# Patient Record
Sex: Female | Born: 1975 | Race: White | Hispanic: No | Smoking: Current every day smoker
Health system: Southern US, Community
[De-identification: ages and names within clinical notes are randomized; demographics above are authoritative.]

## PROBLEM LIST (undated history)

## (undated) DIAGNOSIS — K219 Gastro-esophageal reflux disease without esophagitis: Secondary | ICD-10-CM

## (undated) DIAGNOSIS — T7840XA Allergy, unspecified, initial encounter: Secondary | ICD-10-CM

## (undated) DIAGNOSIS — E079 Disorder of thyroid, unspecified: Secondary | ICD-10-CM

## (undated) DIAGNOSIS — F329 Major depressive disorder, single episode, unspecified: Secondary | ICD-10-CM

## (undated) DIAGNOSIS — F419 Anxiety disorder, unspecified: Secondary | ICD-10-CM

## (undated) DIAGNOSIS — I10 Essential (primary) hypertension: Secondary | ICD-10-CM

## (undated) DIAGNOSIS — F32A Depression, unspecified: Secondary | ICD-10-CM

## (undated) HISTORY — DX: Major depressive disorder, single episode, unspecified: F32.9

## (undated) HISTORY — DX: Depression, unspecified: F32.A

## (undated) HISTORY — DX: Essential (primary) hypertension: I10

## (undated) HISTORY — DX: Allergy, unspecified, initial encounter: T78.40XA

## (undated) HISTORY — DX: Gastro-esophageal reflux disease without esophagitis: K21.9

## (undated) HISTORY — DX: Disorder of thyroid, unspecified: E07.9

## (undated) HISTORY — PX: TOOTH EXTRACTION: SUR596

## (undated) HISTORY — DX: Anxiety disorder, unspecified: F41.9

## (undated) HISTORY — PX: KNEE SURGERY: SHX244

---

## 1999-04-04 ENCOUNTER — Other Ambulatory Visit: Admission: RE | Admit: 1999-04-04 | Discharge: 1999-04-04 | Payer: Self-pay | Admitting: Obstetrics & Gynecology

## 2000-05-15 ENCOUNTER — Other Ambulatory Visit: Admission: RE | Admit: 2000-05-15 | Discharge: 2000-05-15 | Payer: Self-pay | Admitting: Obstetrics & Gynecology

## 2001-05-22 ENCOUNTER — Other Ambulatory Visit: Admission: RE | Admit: 2001-05-22 | Discharge: 2001-05-22 | Payer: Self-pay | Admitting: Obstetrics & Gynecology

## 2002-05-11 ENCOUNTER — Other Ambulatory Visit: Admission: RE | Admit: 2002-05-11 | Discharge: 2002-05-11 | Payer: Self-pay | Admitting: Obstetrics & Gynecology

## 2002-12-18 ENCOUNTER — Inpatient Hospital Stay (HOSPITAL_COMMUNITY): Admission: AD | Admit: 2002-12-18 | Discharge: 2002-12-21 | Payer: Self-pay | Admitting: Obstetrics & Gynecology

## 2002-12-29 ENCOUNTER — Encounter: Admission: RE | Admit: 2002-12-29 | Discharge: 2003-01-28 | Payer: Self-pay | Admitting: Obstetrics & Gynecology

## 2003-01-29 ENCOUNTER — Encounter: Admission: RE | Admit: 2003-01-29 | Discharge: 2003-02-28 | Payer: Self-pay | Admitting: Obstetrics & Gynecology

## 2003-03-31 ENCOUNTER — Encounter: Admission: RE | Admit: 2003-03-31 | Discharge: 2003-04-30 | Payer: Self-pay | Admitting: Obstetrics & Gynecology

## 2003-05-01 ENCOUNTER — Encounter: Admission: RE | Admit: 2003-05-01 | Discharge: 2003-05-31 | Payer: Self-pay | Admitting: Obstetrics & Gynecology

## 2003-06-09 ENCOUNTER — Other Ambulatory Visit: Admission: RE | Admit: 2003-06-09 | Discharge: 2003-06-09 | Payer: Self-pay | Admitting: Obstetrics & Gynecology

## 2004-06-19 ENCOUNTER — Other Ambulatory Visit: Admission: RE | Admit: 2004-06-19 | Discharge: 2004-06-19 | Payer: Self-pay | Admitting: Obstetrics & Gynecology

## 2006-06-03 ENCOUNTER — Encounter (HOSPITAL_COMMUNITY): Admission: RE | Admit: 2006-06-03 | Discharge: 2006-06-04 | Payer: Self-pay | Admitting: Family Medicine

## 2007-11-23 ENCOUNTER — Emergency Department (HOSPITAL_COMMUNITY): Admission: EM | Admit: 2007-11-23 | Discharge: 2007-11-23 | Payer: Self-pay | Admitting: Emergency Medicine

## 2009-11-03 IMAGING — CR DG KNEE COMPLETE 4+V*R*
4 series · 4 of 4 positions shown · non-contrast
Comparison: None

CLINICAL DATA: She has recurrent patellar dislocation

RIGHT KNEE - COMPLETE 4+ VIEW

[t knee ap right]
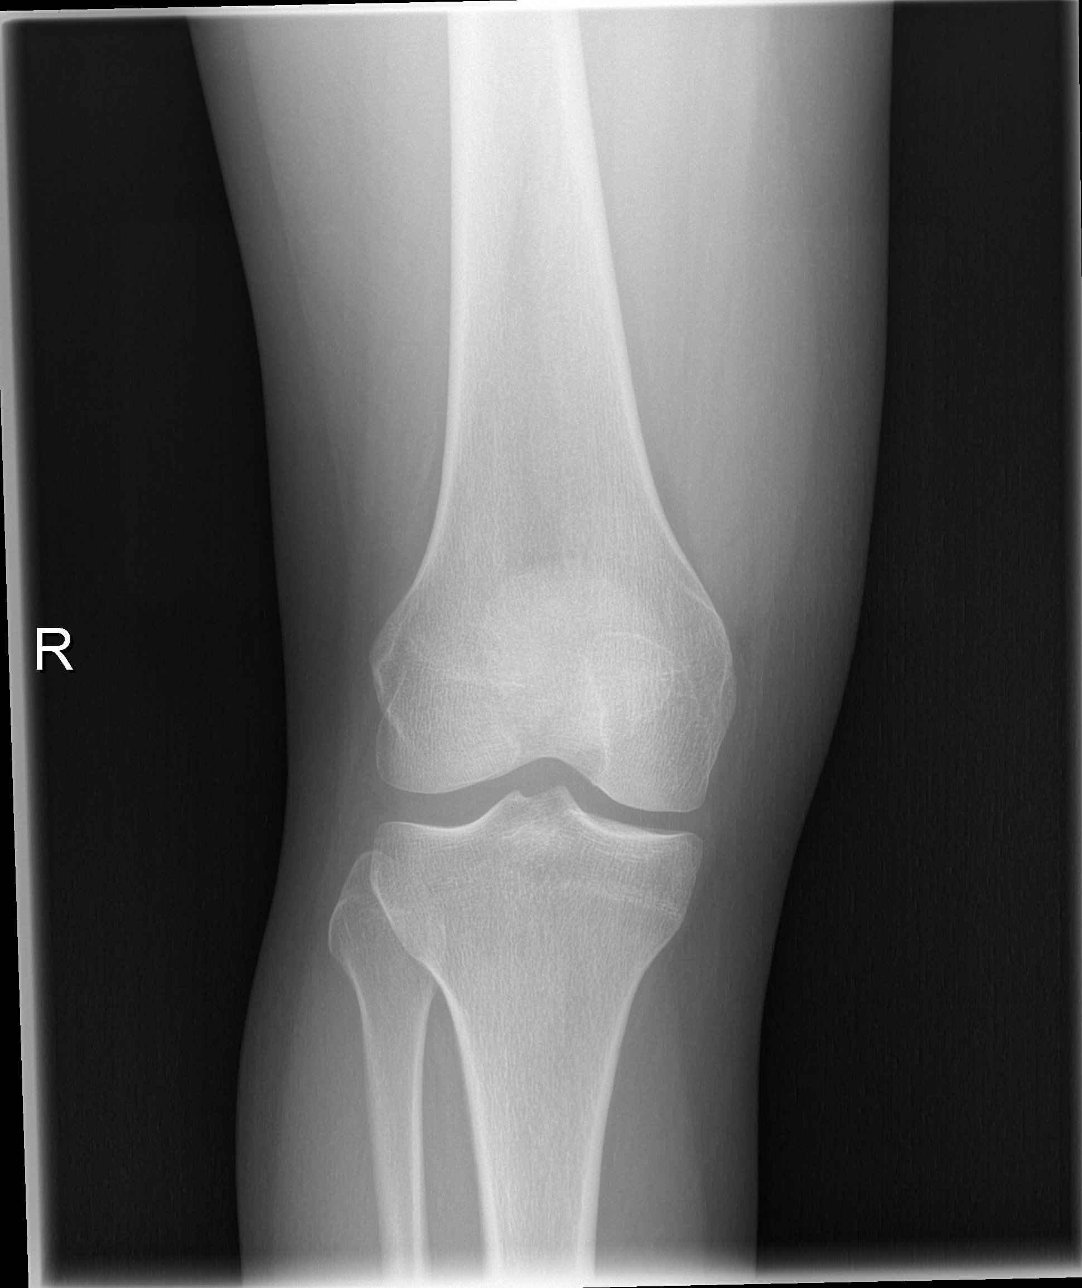

[t knee oblique right (1 of 2)]
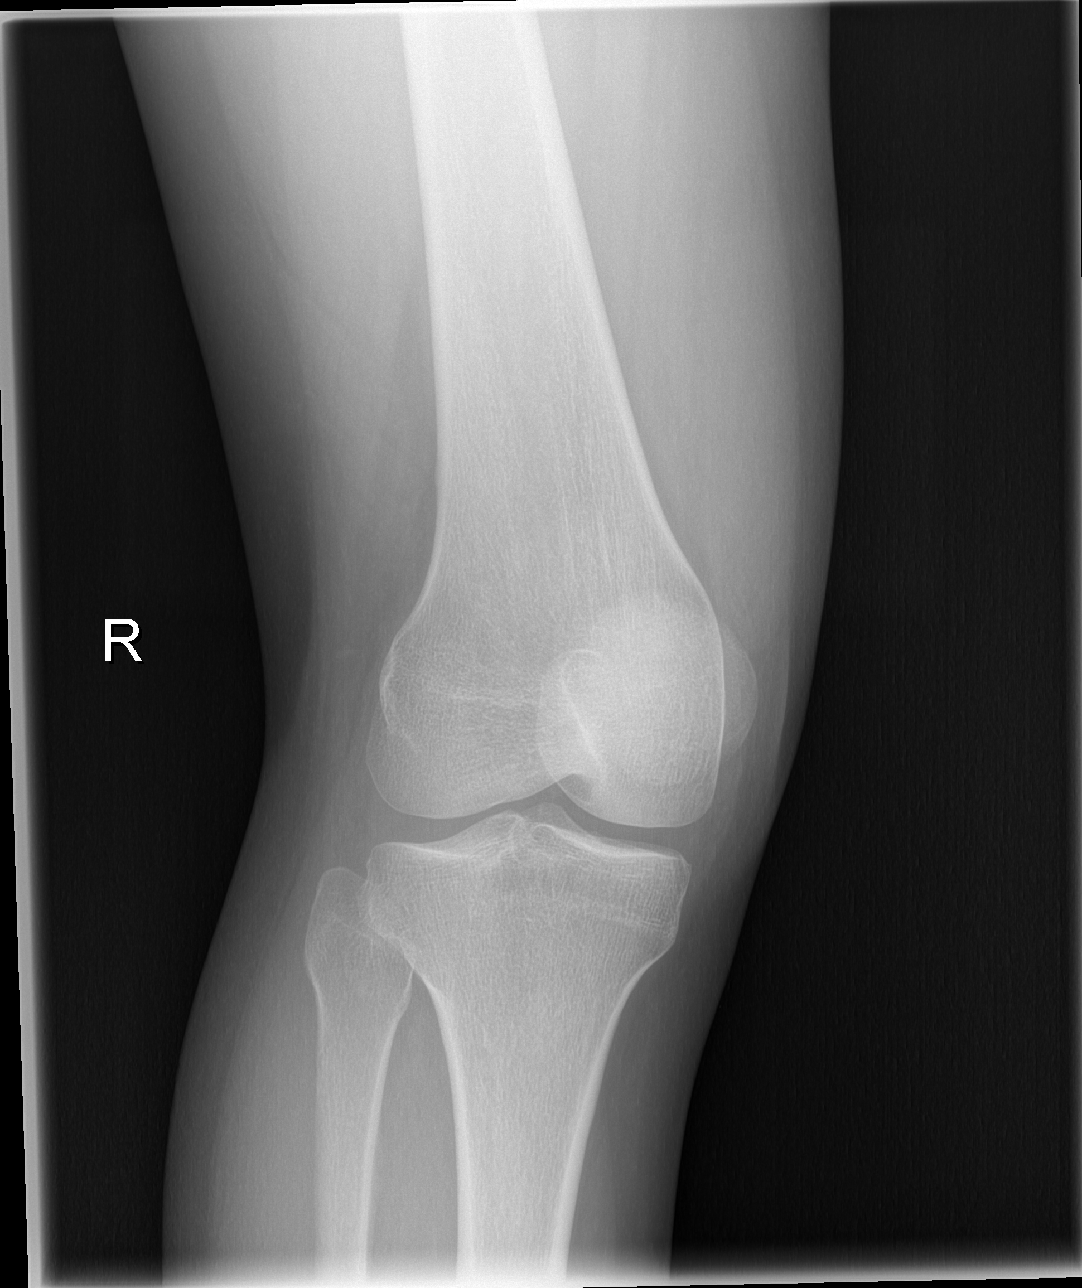

[t knee oblique right (2 of 2)]
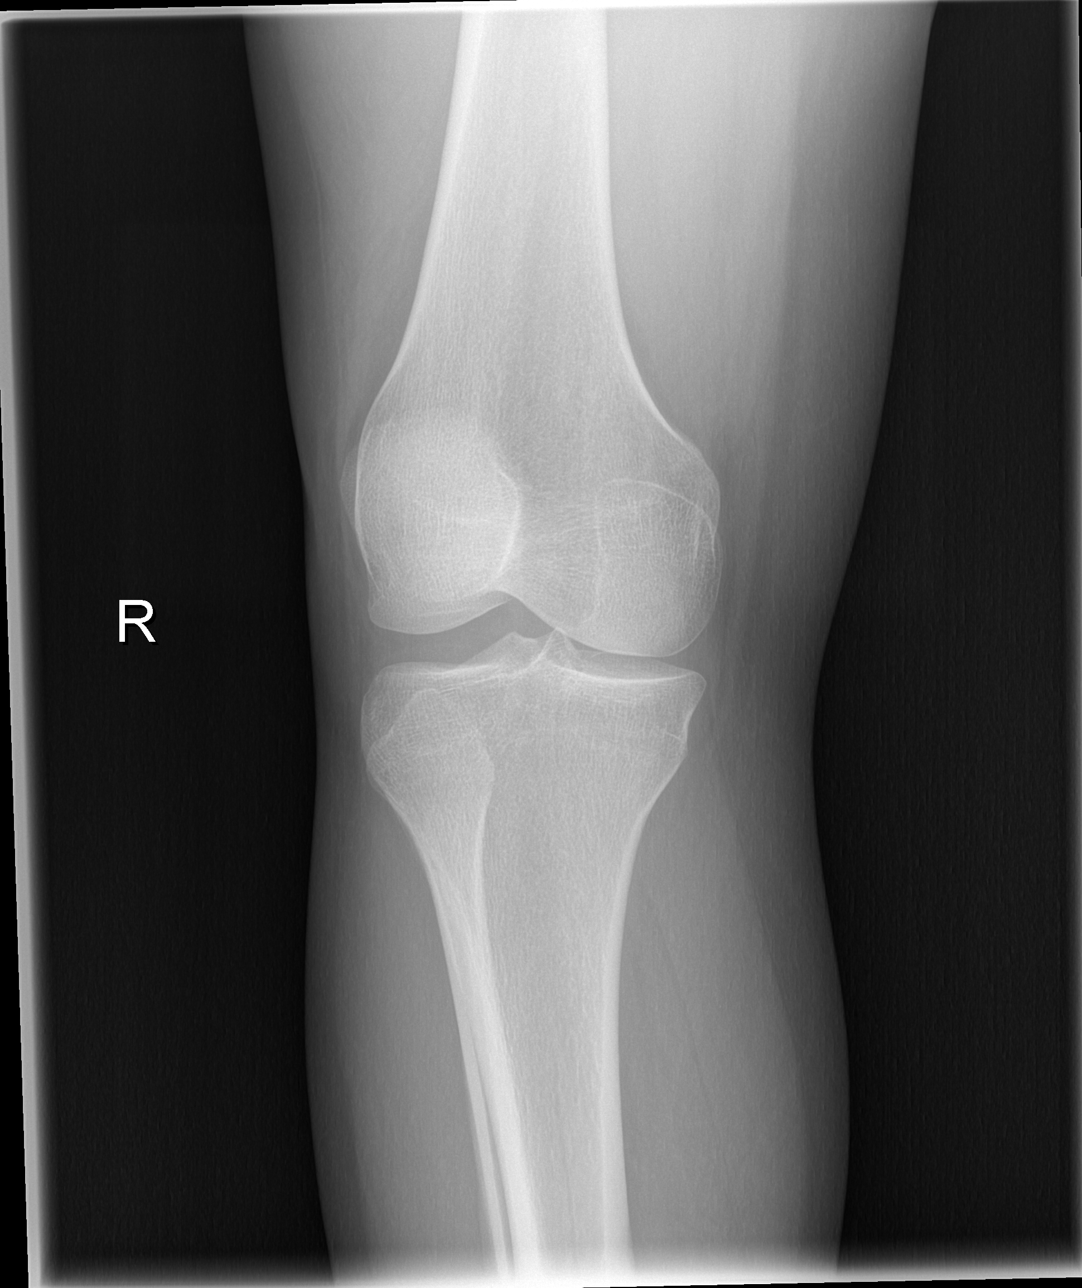

[t knee lat right]
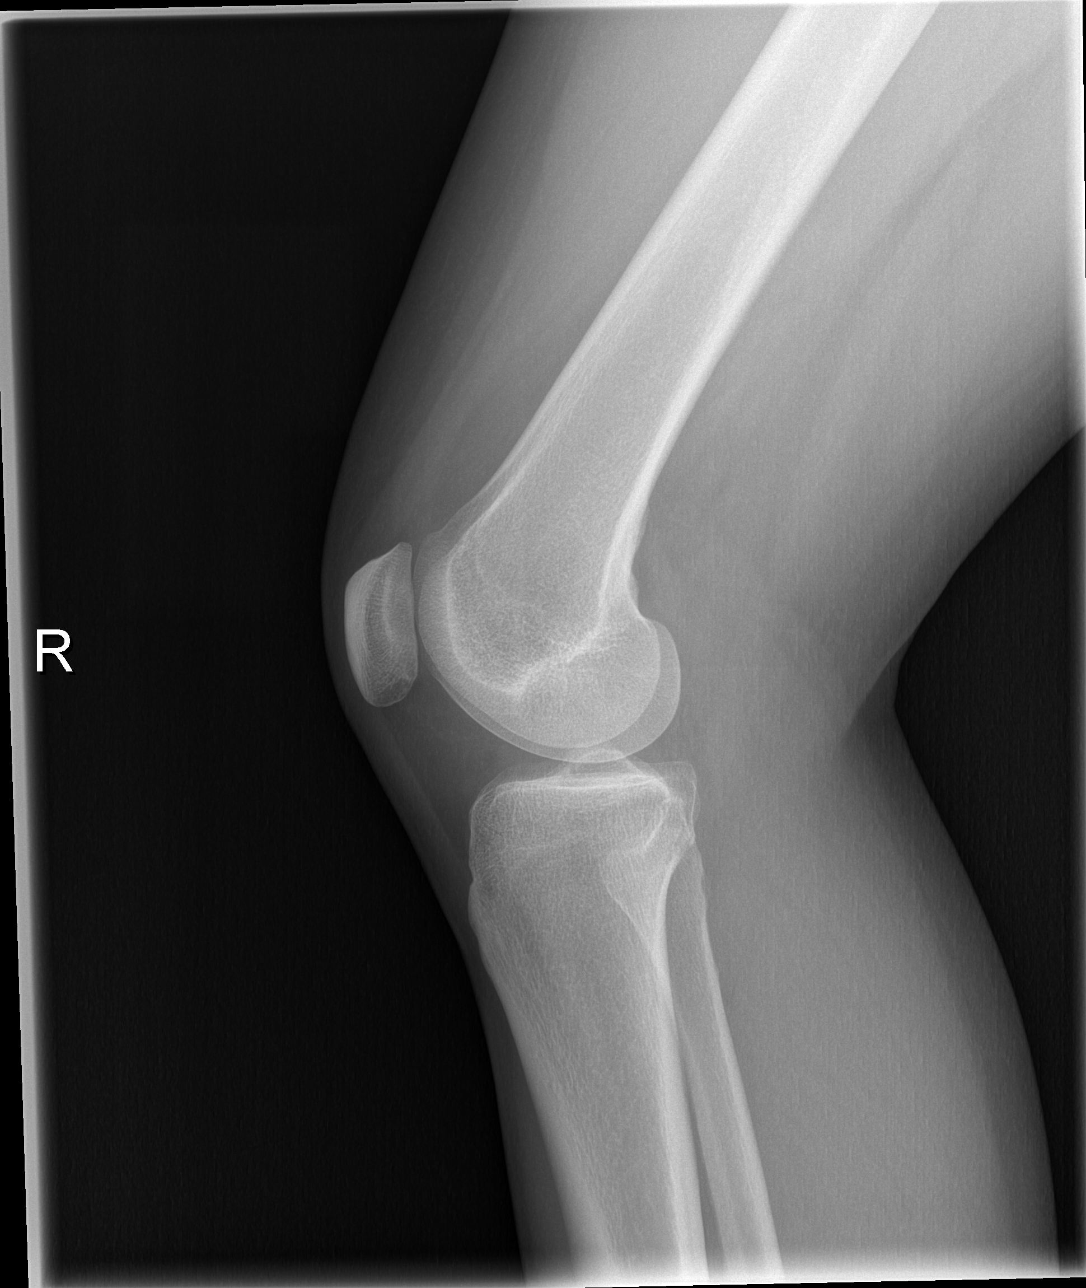

[4 of 4 positions shown; findings below may reference images not displayed]

FINDINGS: No evidence of fracture or dislocation.  No joint
effusion.
IMPRESSION: 1.  No fracture or dislocation.

## 2010-08-11 NOTE — Discharge Summary (Signed)
Donna Chambers, Donna Chambers                      ACCOUNT NO.:  [id number]   MEDICAL RECORD NO.:  [id number]                   PATIENT TYPE:  INP   LOCATION:  9132                                 FACILITY:  [facility]   PHYSICIAN:  Gerrit Friends. Aldona Bar, M.D.                DATE OF BIRTH:  1976/01/21   DATE OF ADMISSION:  12/18/2002  DATE OF DISCHARGE:  12/21/2002                                 DISCHARGE SUMMARY   DISCHARGE DIAGNOSES:  1. Term pregnancy, delivered 7 pound 3 ounce female infant, Apgars 8 and 9.  2. Blood type O+.  3. Mild preeclampsia.   PROCEDURE:  1. Induction of labor.  2. Normal spontaneous delivery.  3. Repair of episiotomy and tears.   SUMMARY:  This 35 year old primigravida was admitted at almost [redacted] weeks  gestation with elevated blood pressure and swelling associated with an 8  pound weight gain over the past two weeks and trace proteinuria.  She was  positive for group B Strep antenatally.  At time of admission she was 3 cm  dilated, 90% effaced with the vertex at -1 station.  Baby was very  reassuring on monitoring.  She did have a reduced potassium.  She was begun  on low dose Pitocin after evaluation and progressed in labor with a  subsequent normal spontaneous delivery of a viable female infant at about noon  on September 25.  Infant weighed 7 pounds 3 ounces and had Apgars of 8 and  9.  There was a large sulcus tear and a midline episiotomy and bilateral  periurethral lacerations which required repair.  She was taken to the  operating room for repair of these lacerations.  Vaginal packing was placed  which was removed on September 26.  On September 26 her potassium was still  slightly low but on the morning of September 27 was 3.6.  Her discharge  hemoglobin on September 27 was 7.6 with a white count of 11,100 and a  platelet count of 182,000.  Her blood pressure was normal.  She was desires  of discharge on the morning of September 27 and accordingly was given all  appropriate instructions and understood all instructions well.   DISCHARGE MEDICATIONS:  1. Vitamins one a day.  2. Ferrous sulfate 300 mg one to two daily.  3. Motrin 600 mg q.6h. as needed for pain.  4. Tylox one to two q.4-6h. as needed for severe pain.   FOLLOWUP:  She will return to the office for follow-up in approximately four  weeks' time and was given all explicit careful instructions at the time of  discharge per discharge brochure and understood all instructions well.   CONDITION ON DISCHARGE:  Improved.  Gerrit Friends. Aldona Bar, M.D.    RMW/MEDQ  D:  12/21/2002  T:  12/21/2002  Job:  [id number]

## 2010-08-11 NOTE — Op Note (Signed)
Donna Chambers, Donna Chambers                      ACCOUNT NO.:  [id number]   MEDICAL RECORD NO.:  [id number]                   PATIENT TYPE:  INP   LOCATION:  9132                                 FACILITY:  [facility]   PHYSICIAN:  Randye Lobo, M.D.                DATE OF BIRTH:  [birth year]   DATE OF PROCEDURE:  12/19/2002  DATE OF DISCHARGE:                                 OPERATIVE REPORT   PREOPERATIVE DIAGNOSES:  1. Status post spontaneous vaginal delivery.  2. Left sulcus tear.  3. Bilateral periurethral lacerations.  4. Midline episiotomy.   POSTOPERATIVE DIAGNOSES:  1. Status post spontaneous vaginal delivery.  2. Left sulcus tear.  3. Bilateral periurethral lacerations.  4. Midline episiotomy.   PROCEDURE:  Repair of left sulcus tear, midline episiotomy, and bilateral  periurethral lacerations.   SURGEON:  Randye Lobo, M.D.   ANESTHESIA:  Epidural.   FLUIDS REPLACED:  800 mL Ringer's lactate with 20 units of Pitocin in 1 L of  solution.   ESTIMATED BLOOD LOSS:  125 mL.   URINE OUTPUT:  200 mL of clear yellow urine.   COMPLICATIONS:  None.   INDICATION FOR PROCEDURE:  The patient is a 35 year old gravida 1, para 0,  female, who was admitted on December 18, 2002, at 41 plus 5 weeks'  gestation with pregnancy-induced hypertension.  The patient underwent  induction of labor with Pitocin for the above and went on to have a  spontaneous vaginal delivery of a viable female delivered over a midline  episiotomy on December 19, 2002.  After spontaneous delivery of the  placenta, the vagina was examined and there was a large left sulcus tear  which was appreciated to extend to the left perirectal space.  There was a  midline episiotomy with no third degree extension and bilateral periurethral  lacerations noted.  Due to the extensive nature of the left sulcus tear, a  decision was made to proceed with a repair in the operating room for patient  comfort and for improved  visualization and assistance.  The patient and  family agreed to proceed.   FINDINGS:  Exam on the operating room table demonstrated a large left sulcus  tear, which extended into the left perirectal space extending toward but not  up to the ischial spine.  There was a midline episiotomy with no third  degree extension.  There were bilateral periurethral lacerations.  The  urethra was intact itself.  The cervix had no evidence of any lacerations.  Rectal examination at the termination of the repair demonstrated the absence  of sutures in the rectum, and there was no hematoma palpable in the left  perirectal space.   SPECIMENS:  None.   DESCRIPTION OF PROCEDURE:  The patient was taken from her labor and delivery  suite directly down to the operating room.  Her epidural, which was already  existing, was dosed for surgical anesthesia.  The patient was placed in the  dorsal lithotomy position, and she was prepped and draped in the usual  sterile fashion.  An exam of the operative field was performed and the  findings are as noted above.  The procedure began with repair of the left  sulcus tear.  Sutures were placed of interrupted 0 Vicryl in the left  perirectal space in order to close the dead space in this region and to  create hemostasis, which was actually good when the procedure began.  This  suturing was performed away from the ischial spine.  This was then closed in  layers.  The closure of the mucosa of the vagina was performed with a  running but not locked suture of 2-0 Vicryl, which was brought down to the  hymen.  The hymen itself also needed to be slightly reconstructed in the  midline, and again figure-of-eight sutures were also placed in this region,  where there was a small extension of the laceration.  Along the perineal  body, interrupted sutures of 0 Vicryl were placed to bring together the  superficial perineal muscles.  A subcuticular closure along the perineal  body  was then performed and the knot was tied up at the level of the hymenal  ring.  The periurethral lacerations were closed last with interrupted  sutures of 3-0 chromic.  The patient was catheterized of urine at the end of  the procedure.  All of the sites were re-examined, found to be hemostatic.  The patient was then expressed of clots, and there were none.  The vagina  was packed with a gauze packing with no medication on it.  A rectal exam was  performed, and there were no foreign bodies or hematomas appreciated.  This  completed the patient's procedure.  There were no complications to the  procedure.  The patient tolerated it well.  All needle, instrument, and  sponge counts were correct.                                               Randye Lobo, M.D.    BES/MEDQ  D:  12/19/2002  T:  12/21/2002  Job:  [id number]

## 2014-11-25 ENCOUNTER — Encounter: Payer: Self-pay | Admitting: *Deleted

## 2014-11-25 ENCOUNTER — Encounter: Payer: Self-pay | Admitting: Family Medicine

## 2014-11-25 ENCOUNTER — Ambulatory Visit (INDEPENDENT_AMBULATORY_CARE_PROVIDER_SITE_OTHER): Payer: BLUE CROSS/BLUE SHIELD | Admitting: Family Medicine

## 2014-11-25 ENCOUNTER — Encounter (INDEPENDENT_AMBULATORY_CARE_PROVIDER_SITE_OTHER): Payer: Self-pay

## 2014-11-25 VITALS — BP 126/64 | HR 77 | Temp 98.0°F | Ht 63.5 in | Wt 180.0 lb

## 2014-11-25 DIAGNOSIS — F418 Other specified anxiety disorders: Secondary | ICD-10-CM | POA: Diagnosis not present

## 2014-11-25 DIAGNOSIS — I10 Essential (primary) hypertension: Secondary | ICD-10-CM | POA: Diagnosis not present

## 2014-11-25 DIAGNOSIS — E0590 Thyrotoxicosis, unspecified without thyrotoxic crisis or storm: Secondary | ICD-10-CM

## 2014-11-25 LAB — CBC WITH DIFFERENTIAL/PLATELET
BASOS ABS: 0 10*3/uL (ref 0.0–0.1)
Basophils Relative: 0.3 % (ref 0.0–3.0)
EOS ABS: 0.4 10*3/uL (ref 0.0–0.7)
Eosinophils Relative: 4.9 % (ref 0.0–5.0)
HCT: 39 % (ref 36.0–46.0)
Hemoglobin: 12.9 g/dL (ref 12.0–15.0)
LYMPHS ABS: 2.4 10*3/uL (ref 0.7–4.0)
Lymphocytes Relative: 28.5 % (ref 12.0–46.0)
MCHC: 33.1 g/dL (ref 30.0–36.0)
MCV: 93.2 fl (ref 78.0–100.0)
MONO ABS: 0.7 10*3/uL (ref 0.1–1.0)
MONOS PCT: 8.6 % (ref 3.0–12.0)
NEUTROS PCT: 57.7 % (ref 43.0–77.0)
Neutro Abs: 5 10*3/uL (ref 1.4–7.7)
Platelets: 335 10*3/uL (ref 150.0–400.0)
RBC: 4.19 Mil/uL (ref 3.87–5.11)
RDW: 15.3 % (ref 11.5–15.5)
WBC: 8.6 10*3/uL (ref 4.0–10.5)

## 2014-11-25 LAB — COMPREHENSIVE METABOLIC PANEL
ALK PHOS: 85 U/L (ref 39–117)
ALT: 14 U/L (ref 0–35)
AST: 14 U/L (ref 0–37)
Albumin: 4.2 g/dL (ref 3.5–5.2)
BILIRUBIN TOTAL: 0.3 mg/dL (ref 0.2–1.2)
BUN: 8 mg/dL (ref 6–23)
CO2: 32 meq/L (ref 19–32)
CREATININE: 0.78 mg/dL (ref 0.40–1.20)
Calcium: 9.4 mg/dL (ref 8.4–10.5)
Chloride: 103 mEq/L (ref 96–112)
GFR: 87.03 mL/min (ref >60.00)
GLUCOSE: 88 mg/dL (ref 70–99)
Potassium: 4 mEq/L (ref 3.5–5.1)
SODIUM: 138 meq/L (ref 135–145)
TOTAL PROTEIN: 7.2 g/dL (ref 6.0–8.3)

## 2014-11-25 LAB — LIPID PANEL
CHOL/HDL RATIO: 4
Cholesterol: 173 mg/dL (ref 0–200)
HDL: 38.4 mg/dL — ABNORMAL LOW (ref >39.00)
LDL Cholesterol: 101 mg/dL — ABNORMAL HIGH (ref 0–99)
NONHDL: 134.29
Triglycerides: 167 mg/dL — ABNORMAL HIGH (ref 0.0–149.0)
VLDL: 33.4 mg/dL (ref 0.0–40.0)

## 2014-11-25 LAB — T4, FREE: Free T4: 0.88 ng/dL (ref 0.60–1.60)

## 2014-11-25 LAB — TSH: TSH: 2.7 u[IU]/mL (ref 0.35–4.50)

## 2014-11-25 NOTE — Patient Instructions (Signed)
Great to see you. We will call you with your lab results and you can view them online.  

## 2014-11-25 NOTE — Assessment & Plan Note (Signed)
Followed by endo. She does want labs today. Awaiting records. Orders Placed This Encounter  Procedures  . CBC with Differential/Platelet  . Comprehensive metabolic panel  . Lipid panel  . TSH  . T4, Free

## 2014-11-25 NOTE — Progress Notes (Signed)
Subjective:   Patient ID: Donna Chambers, female    DOB: Apr 22, 1975, 39 y.o.   MRN: [id number]  Donna Chambers is a pleasant 39 y.o. year old female who presents to clinic today with [place]  on 11/25/2014  HPI:  Depression and anxiety- followed by Letta Moynahan. Symptoms well controlled on current rxs- Wellbutrin 300 mg XL daily, Effexor 300 mg daily, gabapentin and as needed xanax and lunesta.  Hyperthyroidism- followed by endocrinology, Dr Wilson Singer.  Takes beta blocker daily but has not needed any other rx per pt- awaiting records.  Has no complaints today.  No current outpatient prescriptions on file prior to visit.   No current facility-administered medications on file prior to visit.    Allergies  Allergen Reactions  . Penicillins Hives  . Vicodin [Hydrocodone-Acetaminophen] Nausea Only    Past Medical History  Diagnosis Date  . Depression   . GERD (gastroesophageal reflux disease)   . Allergy   . Hypertension   . Thyroid disease     Past Surgical History  Procedure Laterality Date  . Knee surgery Right   . Tooth extraction      Family History  Problem Relation Age of Onset  . Cancer Mother   . Alcohol abuse Father   . Hyperlipidemia Father   . Heart disease Father   . Hypertension Father   . Migraines Sister   . Cancer Paternal Grandmother   . Hyperlipidemia Paternal Grandmother   . Hypertension Paternal Grandmother   . Heart disease Paternal Grandmother     Social History   Social History  . Marital Status: Married    Spouse Name: N/A  . Number of Children: N/A  . Years of Education: N/A   Occupational History  . Not on file.   Social History Main Topics  . Smoking status: Current Every Day Smoker    Types: E-cigarettes  . Smokeless tobacco: Never Used  . Alcohol Use: Yes  . Drug Use: No  . Sexual Activity: Yes    Birth Control/ Protection: IUD   Other Topics Concern  . 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   Review of Systems  Constitutional: Negative.   HENT: Negative.   Respiratory: Negative.   Cardiovascular: Negative.   Gastrointestinal: Negative.   Genitourinary: Negative.   Musculoskeletal: Negative.   Skin: Negative.   Allergic/Immunologic: Negative.   Neurological: Negative.   Hematological: Negative.   Psychiatric/Behavioral: Negative.   All other systems reviewed and are negative.      Objective:    BP 126/64 mmHg  Pulse 77  Temp(Src) 98 F (36.7 C) (Oral)  Ht 5' 3.5" (1.613 m)  Wt 180 lb (81.647 kg)  BMI 31.38 kg/m2  SpO2 96%   Physical Exam  Constitutional: She is oriented to person, place, and time. She appears well-developed and well-nourished. No distress.  HENT:  Head: Normocephalic and atraumatic.  Eyes: Conjunctivae are normal.  Neck: Normal range of motion.  Cardiovascular: Normal rate and regular rhythm.   Pulmonary/Chest: Effort normal and breath sounds normal. No respiratory distress. She has no wheezes. She has no rales.  Abdominal: Soft.  Musculoskeletal: Normal range of motion. She exhibits no edema.  Neurological: She is alert and oriented to person, place, and time. No cranial nerve deficit.  Skin: Skin is warm and dry.  Psychiatric: She has a normal mood and affect. Her behavior is normal. Judgment and thought content normal.  Nursing note and vitals reviewed.         Assessment & Plan:   Depression with anxiety  Essential hypertension  Hyperthyroidism No Follow-up on file.

## 2014-11-25 NOTE — Progress Notes (Signed)
Pre visit review using our clinic review tool, if applicable. No additional management support is needed unless otherwise documented below in the visit note. 

## 2014-11-25 NOTE — Assessment & Plan Note (Signed)
Well controlled.  No changes made. 

## 2014-11-25 NOTE — Assessment & Plan Note (Signed)
Symptoms well controlled and followed by psych. No changes made.

## 2015-03-01 ENCOUNTER — Other Ambulatory Visit: Payer: Self-pay

## 2015-03-01 MED ORDER — METOPROLOL SUCCINATE ER 50 MG PO TB24: 50.0000 mg | ORAL_TABLET | Freq: Every day | ORAL | Status: DC

## 2015-03-01 NOTE — Telephone Encounter (Signed)
Pt request 90 day refill metoprolol to [facility]; pt seen 11/25/14 and refilled per protocol; pt advised refill done.

## 2015-11-30 ENCOUNTER — Other Ambulatory Visit: Payer: Self-pay | Admitting: *Deleted

## 2015-11-30 MED ORDER — METOPROLOL SUCCINATE ER 50 MG PO TB24
50.0000 mg | ORAL_TABLET | Freq: Every day | ORAL | 0 refills | Status: DC
Start: 2015-11-30 — End: 2015-12-26

## 2015-12-26 ENCOUNTER — Other Ambulatory Visit: Payer: Self-pay | Admitting: Family Medicine

## 2016-01-03 ENCOUNTER — Other Ambulatory Visit: Payer: Self-pay

## 2016-01-03 NOTE — Telephone Encounter (Signed)
Ok to refill one time only.  Needs OV for further refills. 

## 2016-01-03 NOTE — Telephone Encounter (Signed)
Pt left v/m; Dr Deborra Medina has not filled effexor or gabapentin before; pt wants to know if Dr Deborra Medina will fill these 2 meds or should pt ck with doctor that has previously prescribed. Pt last seen 11/25/14 and no future appt scheduled.[facility]

## 2016-01-05 MED ORDER — VENLAFAXINE HCL ER 150 MG PO CP24: 150.0000 mg | ORAL_CAPSULE | Freq: Two times a day (BID) | ORAL | 0 refills | Status: DC

## 2016-01-05 MED ORDER — GABAPENTIN 600 MG PO TABS: 600.0000 mg | ORAL_TABLET | Freq: Three times a day (TID) | ORAL | 0 refills | Status: DC

## 2016-01-27 ENCOUNTER — Other Ambulatory Visit: Payer: Self-pay | Admitting: Family Medicine

## 2016-02-05 ENCOUNTER — Other Ambulatory Visit: Payer: Self-pay | Admitting: Family Medicine

## 2016-02-05 DIAGNOSIS — Z01419 Encounter for gynecological examination (general) (routine) without abnormal findings: Secondary | ICD-10-CM

## 2016-02-08 ENCOUNTER — Other Ambulatory Visit: Payer: Self-pay | Admitting: Family Medicine

## 2016-02-08 ENCOUNTER — Other Ambulatory Visit: Payer: BLUE CROSS/BLUE SHIELD

## 2016-02-14 ENCOUNTER — Encounter: Payer: BLUE CROSS/BLUE SHIELD | Admitting: Family Medicine

## 2016-02-15 ENCOUNTER — Other Ambulatory Visit (INDEPENDENT_AMBULATORY_CARE_PROVIDER_SITE_OTHER): Payer: BLUE CROSS/BLUE SHIELD

## 2016-02-15 DIAGNOSIS — Z01419 Encounter for gynecological examination (general) (routine) without abnormal findings: Secondary | ICD-10-CM | POA: Diagnosis not present

## 2016-02-15 LAB — CBC WITH DIFFERENTIAL/PLATELET
BASOS PCT: 0.3 % (ref 0.0–3.0)
Basophils Absolute: 0 10*3/uL (ref 0.0–0.1)
EOS PCT: 4.3 % (ref 0.0–5.0)
Eosinophils Absolute: 0.3 10*3/uL (ref 0.0–0.7)
HEMATOCRIT: 39 % (ref 36.0–46.0)
HEMOGLOBIN: 13.1 g/dL (ref 12.0–15.0)
LYMPHS PCT: 26 % (ref 12.0–46.0)
Lymphs Abs: 1.9 10*3/uL (ref 0.7–4.0)
MCHC: 33.6 g/dL (ref 30.0–36.0)
MCV: 91.2 fl (ref 78.0–100.0)
MONO ABS: 0.6 10*3/uL (ref 0.1–1.0)
MONOS PCT: 7.8 % (ref 3.0–12.0)
Neutro Abs: 4.6 10*3/uL (ref 1.4–7.7)
Neutrophils Relative %: 61.6 % (ref 43.0–77.0)
Platelets: 335 10*3/uL (ref 150.0–400.0)
RBC: 4.27 Mil/uL (ref 3.87–5.11)
RDW: 15.2 % (ref 11.5–15.5)
WBC: 7.4 10*3/uL (ref 4.0–10.5)

## 2016-02-15 LAB — COMPREHENSIVE METABOLIC PANEL
ALBUMIN: 4.2 g/dL (ref 3.5–5.2)
ALK PHOS: 83 U/L (ref 39–117)
ALT: 13 U/L (ref 0–35)
AST: 13 U/L (ref 0–37)
BUN: 8 mg/dL (ref 6–23)
CALCIUM: 9.1 mg/dL (ref 8.4–10.5)
CHLORIDE: 101 meq/L (ref 96–112)
CO2: 30 mEq/L (ref 19–32)
Creatinine, Ser: 0.78 mg/dL (ref 0.40–1.20)
GFR: 86.5 mL/min (ref >60.00)
Glucose, Bld: 90 mg/dL (ref 70–99)
POTASSIUM: 3.8 meq/L (ref 3.5–5.1)
Sodium: 137 mEq/L (ref 135–145)
TOTAL PROTEIN: 7 g/dL (ref 6.0–8.3)
Total Bilirubin: 0.3 mg/dL (ref 0.2–1.2)

## 2016-02-15 LAB — TSH: TSH: 1.89 u[IU]/mL (ref 0.35–4.50)

## 2016-02-15 LAB — LIPID PANEL
CHOLESTEROL: 161 mg/dL (ref 0–200)
HDL: 41.4 mg/dL (ref >39.00)
LDL CALC: 92 mg/dL (ref 0–99)
NonHDL: 120.05
TRIGLYCERIDES: 140 mg/dL (ref 0.0–149.0)
Total CHOL/HDL Ratio: 4
VLDL: 28 mg/dL (ref 0.0–40.0)

## 2016-02-21 ENCOUNTER — Other Ambulatory Visit: Payer: Self-pay | Admitting: Family Medicine

## 2016-02-21 ENCOUNTER — Encounter: Payer: Self-pay | Admitting: Family Medicine

## 2016-02-21 ENCOUNTER — Ambulatory Visit (INDEPENDENT_AMBULATORY_CARE_PROVIDER_SITE_OTHER): Payer: BLUE CROSS/BLUE SHIELD | Admitting: Family Medicine

## 2016-02-21 VITALS — BP 114/62 | Temp 98.3°F | Ht 63.5 in | Wt 178.0 lb

## 2016-02-21 DIAGNOSIS — F418 Other specified anxiety disorders: Secondary | ICD-10-CM

## 2016-02-21 DIAGNOSIS — K219 Gastro-esophageal reflux disease without esophagitis: Secondary | ICD-10-CM | POA: Insufficient documentation

## 2016-02-21 DIAGNOSIS — I10 Essential (primary) hypertension: Secondary | ICD-10-CM

## 2016-02-21 DIAGNOSIS — Z23 Encounter for immunization: Secondary | ICD-10-CM

## 2016-02-21 DIAGNOSIS — Z01419 Encounter for gynecological examination (general) (routine) without abnormal findings: Secondary | ICD-10-CM | POA: Diagnosis not present

## 2016-02-21 DIAGNOSIS — E0590 Thyrotoxicosis, unspecified without thyrotoxic crisis or storm: Secondary | ICD-10-CM | POA: Diagnosis not present

## 2016-02-21 NOTE — Progress Notes (Signed)
Subjective:   Patient ID: Donna Chambers, female    DOB: [birth year], 40 y.o.   MRN: [id number]  Donna Chambers is a pleasant 40 y.o. year old female who presents to clinic today with Annual Exam  on 02/21/2016  HPI:  Has IUD. Has GYN, Dr. Stann Mainland- had pap smear this month.  Depression and anxiety- followed by Letta Moynahan. Symptoms well controlled on current rxs- Wellbutrin 300 mg XL daily, Effexor 300 mg daily, gabapentin and as needed xanax and lunesta.  Hyperthyroidism- followed by endocrinology, Dr Wilson Singer.  Takes beta blocker daily. Lab Results  Component Value Date   TSH 1.89 02/15/2016    Lab Results  Component Value Date   CHOL 161 02/15/2016   HDL 41.40 02/15/2016   LDLCALC 92 02/15/2016   TRIG 140.0 02/15/2016   CHOLHDL 4 02/15/2016   Lab Results  Component Value Date   CREATININE 0.78 02/15/2016   Lab Results  Component Value Date   WBC 7.4 02/15/2016   HGB 13.1 02/15/2016   HCT 39.0 02/15/2016   MCV 91.2 02/15/2016   PLT 335.0 02/15/2016   Lab Results  Component Value Date   ALT 13 02/15/2016   AST 13 02/15/2016   ALKPHOS 83 02/15/2016   BILITOT 0.3 02/15/2016     Current Outpatient Prescriptions on File Prior to Visit  Medication Sig Dispense Refill  . ALPRAZolam (XANAX) 0.5 MG tablet 0.5 mg 3 (three) times daily.   0  . buPROPion (WELLBUTRIN XL) 300 MG 24 hr tablet Take 300 mg by mouth every morning.  3  . Eszopiclone 3 MG TABS Take 3 mg by mouth at bedtime.  2  . Fish Oil-Cholecalciferol (FISH OIL + D3 PO) Take by mouth.    . gabapentin (NEURONTIN) 600 MG tablet TAKE ONE TABLET BY MOUTH 3 TIMES DAILY 30 tablet 0  . metoprolol succinate (TOPROL-XL) 50 MG 24 hr tablet TAKE 1 TABLET BY MOUTH DAILY. MUST HAVE OFFICE VISIT FOR MORE REFILLS 30 tablet 0  . Misc Natural Products (TART CHERRY ADVANCED PO) Take by mouth.    . Multiple Vitamin (MULTIVITAMIN) capsule Take 4 capsules by mouth daily.    Marland Kitchen omeprazole (PRILOSEC) 40 MG capsule Take  40 mg by mouth daily.  3  . venlafaxine XR (EFFEXOR-XR) 150 MG 24 hr capsule TAKE 1 CAPSULE BY MOUTH TWICE DAILY 60 capsule 0   No current facility-administered medications on file prior to visit.     Allergies  Allergen Reactions  . Penicillins Hives  . Vicodin [Hydrocodone-Acetaminophen] Nausea Only    Past Medical History:  Diagnosis Date  . Allergy   . Depression   . GERD (gastroesophageal reflux disease)   . Hypertension   . Thyroid disease     Past Surgical History:  Procedure Laterality Date  . KNEE SURGERY Right   . TOOTH EXTRACTION      Family History  Problem Relation Age of Onset  . Cancer Mother   . Alcohol abuse Father   . Hyperlipidemia Father   . Heart disease Father   . Hypertension Father   . Migraines Sister   . Cancer Paternal Grandmother   . Hyperlipidemia Paternal Grandmother   . Hypertension Paternal Grandmother   . Heart disease Paternal Grandmother     Social History   Social History  . Marital status: Married    Spouse name: N/A  . Number of children: N/A  . Years of education: N/A   Occupational History  . Not on file.  Social History Main Topics  . Smoking status: Current Every Day Smoker    Types: E-cigarettes  . Smokeless tobacco: Never Used  . Alcohol use Yes  . Drug use: No  . Sexual activity: Yes    Birth control/ protection: IUD   Other Topics Concern  . 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   Review of Systems  Constitutional: Negative.   HENT: Negative.   Respiratory: Negative.   Cardiovascular: Negative.   Gastrointestinal: Negative.   Genitourinary: Negative.   Musculoskeletal: Negative.   Skin: Negative.   Allergic/Immunologic: Negative.   Neurological: Negative.   Hematological: Negative.   Psychiatric/Behavioral:  Negative.   All other systems reviewed and are negative.      Objective:    BP 114/62   Temp 98.3 F (36.8 C) (Oral)   Ht 5' 3.5" (1.613 m)   Wt 178 lb (80.7 kg)   SpO2 97%   BMI 31.04 kg/m    Physical Exam  Constitutional: She is oriented to person, place, and time. She appears well-developed and well-nourished. No distress.  HENT:  Head: Normocephalic and atraumatic.  Eyes: Conjunctivae are normal.  Neck: Normal range of motion.  Cardiovascular: Normal rate and regular rhythm.   Pulmonary/Chest: Effort normal and breath sounds normal. No respiratory distress. She has no wheezes. She has no rales.  Abdominal: Soft.  Musculoskeletal: Normal range of motion. She exhibits no edema.  Neurological: She is alert and oriented to person, place, and time. No cranial nerve deficit.  Skin: Skin is warm and dry.  Psychiatric: She has a normal mood and affect. Her behavior is normal. Judgment and thought content normal.  Nursing note and vitals reviewed.         Assessment & Plan:   Hyperthyroidism  Well woman exam  Depression with anxiety  Essential hypertension  Gastroesophageal reflux disease, esophagitis presence not specified  Need for influenza vaccination - Plan: Flu Vaccine QUAD 36+ mos IM (Fluarix & Fluzone Quad PF No Follow-up on file.

## 2016-02-21 NOTE — Assessment & Plan Note (Signed)
Well controlled.  No changes made. 

## 2016-02-21 NOTE — Assessment & Plan Note (Signed)
Reviewed preventive care protocols, scheduled due services, and updated immunizations Discussed nutrition, exercise, diet, and healthy lifestyle.  

## 2016-02-21 NOTE — Progress Notes (Signed)
Pre visit review using our clinic review tool, if applicable. No additional management support is needed unless otherwise documented below in the visit note. 

## 2016-02-21 NOTE — Patient Instructions (Signed)
Great to see you. Happy Holidays.   

## 2016-02-28 ENCOUNTER — Ambulatory Visit (INDEPENDENT_AMBULATORY_CARE_PROVIDER_SITE_OTHER): Payer: BLUE CROSS/BLUE SHIELD | Admitting: Family

## 2016-02-28 ENCOUNTER — Encounter: Payer: Self-pay | Admitting: Family

## 2016-02-28 DIAGNOSIS — J309 Allergic rhinitis, unspecified: Secondary | ICD-10-CM | POA: Diagnosis not present

## 2016-02-28 NOTE — Progress Notes (Signed)
Pre visit review using our clinic review tool, if applicable. No additional management support is needed unless otherwise documented below in the visit note. 

## 2016-02-28 NOTE — Patient Instructions (Signed)
Pleasure meeting you.  Suspect allergies.  Conservative treatment  Trial OTC hydrocortisone for ear itching.

## 2016-02-28 NOTE — Progress Notes (Signed)
Subjective:    Patient ID: Donna Chambers, female    DOB: [birth year], 40 y.o.   MRN: [id number]  CC: Donna Chambers is a 40 y.o. female who presents today for an acute visit.    HPI: CC: clear runny sinus congestion and sinus and eye pressure, and left ear itching for past 6 days,unchanged . Endorses HA ( 'not worst HA of life')  tactile warmth.   Tried doxycycline and ibuprofen with little relief . No fever, chills, cough.   Current smoker  H/o seasonal allergies. On allergy shots, claritin, and flonase.     HISTORY:  Past Medical History:  Diagnosis Date  . Allergy   . Depression   . GERD (gastroesophageal reflux disease)   . Hypertension   . Thyroid disease    Past Surgical History:  Procedure Laterality Date  . KNEE SURGERY Right   . TOOTH EXTRACTION     Family History  Problem Relation Age of Onset  . Cancer Mother   . Alcohol abuse Father   . Hyperlipidemia Father   . Heart disease Father   . Hypertension Father   . Migraines Sister   . Cancer Paternal Grandmother   . Hyperlipidemia Paternal Grandmother   . Hypertension Paternal Grandmother   . Heart disease Paternal Grandmother     Allergies: Penicillins and Vicodin [hydrocodone-acetaminophen] Current Outpatient Prescriptions on File Prior to Visit  Medication Sig Dispense Refill  . ALPRAZolam (XANAX) 0.5 MG tablet 0.5 mg 3 (three) times daily.   0  . buPROPion (WELLBUTRIN XL) 300 MG 24 hr tablet Take 300 mg by mouth every morning.  3  . Eszopiclone 3 MG TABS Take 3 mg by mouth at bedtime.  2  . Fish Oil-Cholecalciferol (FISH OIL + D3 PO) Take by mouth.    . gabapentin (NEURONTIN) 600 MG tablet TAKE ONE TABLET BY MOUTH 3 TIMES DAILY 30 tablet 0  . metoprolol succinate (TOPROL-XL) 50 MG 24 hr tablet TAKE ONE TABLET EVERY DAY MUST HAVE OFFICE VISIT FOR MORE REFILLS 90 tablet 2  . Misc Natural Products (TART CHERRY ADVANCED PO) Take by mouth.    . Multiple Vitamin (MULTIVITAMIN) capsule Take 4  capsules by mouth daily.    Marland Kitchen omeprazole (PRILOSEC) 40 MG capsule Take 40 mg by mouth daily.  3  . venlafaxine XR (EFFEXOR-XR) 150 MG 24 hr capsule TAKE 1 CAPSULE BY MOUTH TWICE DAILY 60 capsule 0   No current facility-administered medications on file prior to visit.     Social History  Substance Use Topics  . Smoking status: Current Every Day Smoker    Types: E-cigarettes  . Smokeless tobacco: Never Used  . Alcohol use Yes    Review of Systems  Constitutional: Negative for chills and fever.  HENT: Positive for congestion, ear pain (left), sinus pressure and sore throat.   Respiratory: Negative for cough, shortness of breath and wheezing.   Cardiovascular: Negative for chest pain and palpitations.  Gastrointestinal: Negative for nausea and vomiting.      Objective:    BP 128/88   Pulse 82   Temp 97.7 F (36.5 C) (Oral)   Wt 183 lb (83 kg)   SpO2 98%   BMI 31.91 kg/m    Physical Exam  Constitutional: She appears well-developed and well-nourished.  HENT:  Head: Normocephalic and atraumatic.  Right Ear: Hearing, tympanic membrane, external ear and ear canal normal. No drainage, swelling or tenderness. No foreign bodies. Tympanic membrane is not erythematous and not bulging. No  middle ear effusion. No decreased hearing is noted.  Left Ear: Hearing, tympanic membrane, external ear and ear canal normal. No drainage, swelling or tenderness. No foreign bodies. Tympanic membrane is not erythematous and not bulging.  No middle ear effusion. No decreased hearing is noted.  Nose: Nose normal. No rhinorrhea. Right sinus exhibits no maxillary sinus tenderness and no frontal sinus tenderness. Left sinus exhibits no maxillary sinus tenderness and no frontal sinus tenderness.  Mouth/Throat: Uvula is midline, oropharynx is clear and moist and mucous membranes are normal. No oropharyngeal exudate, posterior oropharyngeal edema, posterior oropharyngeal erythema or tonsillar abscesses.  Left  ear canal excoriated and dry skin noted.   Eyes: Conjunctivae are normal.  Cardiovascular: Regular rhythm, normal heart sounds and normal pulses.   Pulmonary/Chest: Effort normal and breath sounds normal. She has no wheezes. She has no rhonchi. She has no rales.  Lymphadenopathy:       Head (right side): No submental, no submandibular, no tonsillar, no preauricular, no posterior auricular and no occipital adenopathy present.       Head (left side): No submental, no submandibular, no tonsillar, no preauricular, no posterior auricular and no occipital adenopathy present.    She has no cervical adenopathy.  Neurological: She is alert.  Skin: Skin is warm and dry.  Psychiatric: She has a normal mood and affect. Her speech is normal and behavior is normal. Thought content normal.  Vitals reviewed.      Assessment & Plan:   1. Allergic sinusitis Working diagnosis of allergic sinusitis. Afebrile. Suspect left ear itching allergic as well, no signs of infection on exam today. Advised conservative therapy and may trial small dose OTC cortisone cream applied in outer ear to control itching. Continue allergy medication regimen   I am having Ms. Petko maintain her ALPRAZolam, buPROPion, omeprazole, Eszopiclone, multivitamin, Misc Natural Products (TART CHERRY ADVANCED PO), Fish Oil-Cholecalciferol (FISH OIL + D3 PO), venlafaxine XR, gabapentin, and metoprolol succinate.   No orders of the defined types were placed in this encounter.   Return precautions given.   Risks, benefits, and alternatives of the medications and treatment plan prescribed today were discussed, and patient expressed understanding.   Education regarding symptom management and diagnosis given to patient on AVS.  Continue to follow with Arnette Norris, MD for routine health maintenance.   Donna Chambers and I agreed with plan.   Mable Paris, FNP

## 2016-03-01 ENCOUNTER — Ambulatory Visit: Payer: BLUE CROSS/BLUE SHIELD | Admitting: Obstetrics and Gynecology

## 2016-03-12 ENCOUNTER — Other Ambulatory Visit: Payer: Self-pay | Admitting: Family Medicine

## 2016-03-22 ENCOUNTER — Other Ambulatory Visit: Payer: Self-pay | Admitting: Family Medicine

## 2016-03-27 ENCOUNTER — Other Ambulatory Visit: Payer: Self-pay | Admitting: Family Medicine

## 2016-03-27 NOTE — Telephone Encounter (Signed)
Last f/u 01/2016

## 2016-03-28 NOTE — Telephone Encounter (Signed)
Rx called in to requested pharmacy 

## 2016-03-31 ENCOUNTER — Other Ambulatory Visit: Payer: Self-pay | Admitting: Family Medicine

## 2016-04-03 NOTE — Telephone Encounter (Signed)
Ok to refill one time only.  Needs OV for further refills. 

## 2016-04-03 NOTE — Telephone Encounter (Signed)
Per chart, pt has never been seen for insomnia and is not on her problem list

## 2016-04-04 NOTE — Telephone Encounter (Signed)
Rx called in to requested pharmacy 

## 2016-04-16 ENCOUNTER — Other Ambulatory Visit: Payer: Self-pay | Admitting: Family Medicine

## 2016-05-21 ENCOUNTER — Other Ambulatory Visit: Payer: Self-pay | Admitting: Family Medicine

## 2016-05-22 NOTE — Telephone Encounter (Signed)
Ok to refill xanax? Please advise. -Paty

## 2016-05-22 NOTE — Telephone Encounter (Signed)
Rx faxed to [facility] at [phone]. 

## 2016-06-07 ENCOUNTER — Encounter: Payer: Self-pay | Admitting: Family Medicine

## 2016-06-07 ENCOUNTER — Ambulatory Visit (INDEPENDENT_AMBULATORY_CARE_PROVIDER_SITE_OTHER): Payer: BLUE CROSS/BLUE SHIELD | Admitting: Family Medicine

## 2016-06-07 ENCOUNTER — Encounter (INDEPENDENT_AMBULATORY_CARE_PROVIDER_SITE_OTHER): Payer: Self-pay

## 2016-06-07 VITALS — BP 110/76 | HR 86 | Temp 98.0°F | Wt 186.0 lb

## 2016-06-07 DIAGNOSIS — I10 Essential (primary) hypertension: Secondary | ICD-10-CM | POA: Diagnosis not present

## 2016-06-07 DIAGNOSIS — K219 Gastro-esophageal reflux disease without esophagitis: Secondary | ICD-10-CM

## 2016-06-07 DIAGNOSIS — E0590 Thyrotoxicosis, unspecified without thyrotoxic crisis or storm: Secondary | ICD-10-CM | POA: Diagnosis not present

## 2016-06-07 DIAGNOSIS — F418 Other specified anxiety disorders: Secondary | ICD-10-CM | POA: Diagnosis not present

## 2016-06-07 MED ORDER — ESZOPICLONE 3 MG PO TABS: 3.0000 mg | ORAL_TABLET | Freq: Every day | ORAL | 0 refills | Status: DC

## 2016-06-07 NOTE — Progress Notes (Signed)
Subjective:   Patient ID: Donna Chambers, female    DOB: 29-Aug-1975, 41 y.o.   MRN: [id number]  Donna Chambers is a pleasant 41 y.o. year old female who presents to clinic today with Psychiatrist Retiring (Wants to discuss Dr Deborra Medina taking over meds.)  on 06/07/2016  HPI:  Depression with anxiety- Her psychiatrist (Dr. Paula Libra) is retiring and she would like for me to take over prescribing her rxs. Her symptoms have been well controlled with Wellbutrin 300 mg XL daily, Gabapentin 600 mg three times daily, Effexor 150 mg twice daily, xanax 0.5 mg as needed (usually 3 times daily).  Denies any symptoms of anxiety or depression. Symptoms have been stable on same rxs for over 5 years.  Also takes Lunesta for insomnia.  Current Outpatient Prescriptions on File Prior to Visit  Medication Sig Dispense Refill  . ALPRAZolam (XANAX) 0.5 MG tablet TAKE ONE TABLET BY MOUTH 2 OR 3 TIMES DAILY 90 tablet 0  . buPROPion (WELLBUTRIN XL) 300 MG 24 hr tablet Take 300 mg by mouth every morning.  3  . Eszopiclone 3 MG TABS Take 1 tablet (3 mg total) by mouth at bedtime. 15 tablet 0  . Fish Oil-Cholecalciferol (FISH OIL + D3 PO) Take by mouth.    . gabapentin (NEURONTIN) 600 MG tablet TAKE ONE TABLET BY MOUTH 3 TIMES DAILY 90 tablet 0  . metoprolol succinate (TOPROL-XL) 50 MG 24 hr tablet TAKE ONE TABLET EVERY DAY MUST HAVE OFFICE VISIT FOR MORE REFILLS 90 tablet 2  . Multiple Vitamin (MULTIVITAMIN) capsule Take 4 capsules by mouth daily.    Marland Kitchen omeprazole (PRILOSEC) 40 MG capsule Take 40 mg by mouth daily.  3  . venlafaxine XR (EFFEXOR-XR) 150 MG 24 hr capsule TAKE 1 CAPSULE TWICE DAILY 180 capsule 2  . Misc Natural Products (TART CHERRY ADVANCED PO) Take by mouth.     No current facility-administered medications on file prior to visit.     Allergies  Allergen Reactions  . Penicillins Hives  . Vicodin [Hydrocodone-Acetaminophen] Nausea Only    Past Medical History:  Diagnosis Date    . Allergy   . Depression   . GERD (gastroesophageal reflux disease)   . Hypertension   . Thyroid disease     Past Surgical History:  Procedure Laterality Date  . KNEE SURGERY Right   . TOOTH EXTRACTION      Family History  Problem Relation Age of Onset  . Cancer Mother   . Alcohol abuse Father   . Hyperlipidemia Father   . Heart disease Father   . Hypertension Father   . Migraines Sister   . Cancer Paternal Grandmother   . Hyperlipidemia Paternal Grandmother   . Hypertension Paternal Grandmother   . Heart disease Paternal Grandmother     Social History   Social History  . Marital status: Married    Spouse name: N/A  . Number of children: N/A  . Years of education: N/A   Occupational History  . Not on file.   Social History Main Topics  . Smoking status: Current Every Day Smoker    Types: E-cigarettes  . Smokeless tobacco: Never Used  . Alcohol use Yes  . Drug use: No  . Sexual activity: Yes    Birth control/ protection: IUD   Other Topics Concern  . Not on file   Social History Narrative   One son   Civil engineer, contracting for [employer]      The PMH, [place], Social History, Family  History, Medications, and allergies have been reviewed in [facility], and have been updated if relevant.   Review of Systems  Psychiatric/Behavioral: Negative for agitation, behavioral problems, confusion, decreased concentration, dysphoric mood, hallucinations, self-injury, sleep disturbance and suicidal ideas. The patient is not nervous/anxious and is not hyperactive.   All other systems reviewed and are negative.      Objective:    BP 110/76 (BP Location: Left Arm, Patient Position: Sitting, Cuff Size: Large)   Pulse 86   Temp 98 F (36.7 C) (Oral)   Wt 186 lb (84.4 kg)   SpO2 97%   BMI 32.43 kg/m    Physical Exam  Constitutional: She is oriented to person, place, and time. She appears well-developed and well-nourished. No distress.  HENT:  Head: Normocephalic and atraumatic.   Eyes: Conjunctivae are normal.  Cardiovascular: Normal rate.   Pulmonary/Chest: Effort normal.  Musculoskeletal: Normal range of motion. She exhibits no edema.  Neurological: She is alert and oriented to person, place, and time. No cranial nerve deficit.  Skin: Skin is warm and dry. She is not diaphoretic.  Psychiatric: She has a normal mood and affect. Her behavior is normal. Judgment and thought content normal.  Nursing note and vitals reviewed.         Assessment & Plan:   Depression with anxiety  Hyperthyroidism  Gastroesophageal reflux disease, esophagitis presence not specified  Essential hypertension No Follow-up on file.

## 2016-06-07 NOTE — Assessment & Plan Note (Signed)
>  25 minutes spent in face to face time with patient, >50% spent in counselling or coordination of care Discussed with pt that since she has been stable, I can take over prescribing rxs but if symptoms deteriorate, will need to refer her to a new psychiatrist. The patient indicates understanding of these issues and agrees with the plan.

## 2016-06-07 NOTE — Progress Notes (Signed)
Pre visit review using our clinic review tool, if applicable. No additional management support is needed unless otherwise documented below in the visit note. 

## 2016-06-09 ENCOUNTER — Other Ambulatory Visit: Payer: Self-pay | Admitting: Family Medicine

## 2016-06-21 ENCOUNTER — Other Ambulatory Visit: Payer: Self-pay | Admitting: Family Medicine

## 2016-06-21 NOTE — Telephone Encounter (Signed)
Called Rx to the pharmacy 

## 2016-06-21 NOTE — Telephone Encounter (Signed)
Pt last seen 06/07/2016, last refill 05/22/16.Marland Kitchenplease advise

## 2016-06-21 NOTE — Telephone Encounter (Signed)
Called Rx alprazolam to pharmacy

## 2016-06-25 ENCOUNTER — Other Ambulatory Visit: Payer: Self-pay | Admitting: Family Medicine

## 2016-07-12 ENCOUNTER — Other Ambulatory Visit: Payer: Self-pay | Admitting: Family Medicine

## 2016-07-12 NOTE — Telephone Encounter (Signed)
Left refill on voice mail at pharmacy  

## 2016-07-12 NOTE — Telephone Encounter (Signed)
Last filled 06-09-16 #30 Last ov 06-07-16 No Future OV

## 2016-07-21 ENCOUNTER — Other Ambulatory Visit: Payer: Self-pay | Admitting: Family Medicine

## 2016-07-23 ENCOUNTER — Other Ambulatory Visit: Payer: Self-pay | Admitting: Family Medicine

## 2016-07-23 NOTE — Telephone Encounter (Signed)
Last filled 06-21-16 #90 Last OV 06-07-16 No Future OV

## 2016-07-23 NOTE — Telephone Encounter (Signed)
Verbal refill given to carol at pharmacy

## 2016-08-13 ENCOUNTER — Other Ambulatory Visit: Payer: Self-pay | Admitting: Family Medicine

## 2016-08-13 NOTE — Telephone Encounter (Signed)
FAXED TO  [place], [place] - [address]  [place] [address]  Phone: [phone] Fax: [phone]

## 2016-08-13 NOTE — Telephone Encounter (Signed)
Last refill 07/12/16 #30, last OV 06/07/16

## 2016-08-17 ENCOUNTER — Other Ambulatory Visit: Payer: Self-pay | Admitting: Family Medicine

## 2016-08-20 ENCOUNTER — Other Ambulatory Visit: Payer: Self-pay | Admitting: Family Medicine

## 2016-08-21 NOTE — Telephone Encounter (Signed)
Last refill 07/23/16, last OV 06/07/16

## 2016-08-21 NOTE — Telephone Encounter (Signed)
Faxed to [facility], [place]: [phone]

## 2016-09-13 ENCOUNTER — Other Ambulatory Visit: Payer: Self-pay | Admitting: Family Medicine

## 2016-09-13 NOTE — Telephone Encounter (Signed)
Last refill 08/13/16 Last OV 06/07/16 Ok to refill?

## 2016-09-13 NOTE — Telephone Encounter (Signed)
Faxed to  [place], [place] - [place]  [place] [address]  Phone: [phone] Fax: [phone]

## 2016-09-17 ENCOUNTER — Other Ambulatory Visit: Payer: Self-pay | Admitting: Family Medicine

## 2016-10-15 ENCOUNTER — Other Ambulatory Visit: Payer: Self-pay | Admitting: Family Medicine

## 2016-10-15 NOTE — Telephone Encounter (Signed)
Faxed to  [place], [place] - [place]  [place] [address]  Phone: [phone] Fax: [phone]

## 2016-10-15 NOTE — Telephone Encounter (Signed)
Last refill  Xanax  08/21/16 Eszopiclone last  Refill 09/13/16  Last OV 06/07/16  Ok to refill?

## 2016-10-30 ENCOUNTER — Other Ambulatory Visit: Payer: Self-pay | Admitting: Family Medicine

## 2016-11-08 ENCOUNTER — Other Ambulatory Visit: Payer: Self-pay | Admitting: Family Medicine

## 2016-12-18 ENCOUNTER — Other Ambulatory Visit: Payer: Self-pay | Admitting: Family Medicine

## 2016-12-18 NOTE — Telephone Encounter (Signed)
Script has refill 1 refill on filled today spoke with Elmyra Ricks at [facility], disregard refill request.

## 2016-12-24 ENCOUNTER — Other Ambulatory Visit: Payer: Self-pay | Admitting: Family Medicine

## 2016-12-26 NOTE — Telephone Encounter (Signed)
WELLBUTRIN LAST FILLED ON 09/18/16 #90 GABAPENTIN 07/23/16 #270 + 1 Last ov 06/07/16

## 2017-01-18 ENCOUNTER — Other Ambulatory Visit: Payer: Self-pay | Admitting: Family Medicine

## 2017-01-18 NOTE — Telephone Encounter (Signed)
Requesting: Eszopiclone Contract: None UDS: None Last OV: 3.15.2018 Next OV: Not scheduled Last Refill: 1.10.2018   Please advise

## 2017-01-21 ENCOUNTER — Other Ambulatory Visit: Payer: Self-pay | Admitting: Family Medicine

## 2017-01-21 NOTE — Telephone Encounter (Signed)
#  15 was called in this am/pt needs appt for more refills/thx dmf

## 2017-01-21 NOTE — Telephone Encounter (Signed)
Called pharm gave verbal for #15 needs OV for further fills/thx dmf

## 2017-02-08 ENCOUNTER — Other Ambulatory Visit: Payer: Self-pay | Admitting: Family Medicine

## 2017-02-11 ENCOUNTER — Other Ambulatory Visit: Payer: Self-pay | Admitting: Family Medicine

## 2017-02-11 NOTE — Telephone Encounter (Signed)
Last filled 01/08/17, last OV 06/07/16, okay to refill?

## 2017-02-12 ENCOUNTER — Other Ambulatory Visit: Payer: Self-pay | Admitting: Family Medicine

## 2017-02-13 NOTE — Telephone Encounter (Signed)
Copied from [place] #[id number]. Topic: Quick Communication - See Telephone Encounter >> Feb 13, 2017  9:24 AM Synthia Innocent wrote: CRM for notification. See Telephone encounter for:  Requesting refill on Xanax 0.5mg  to be faxed to [facility] Fax # [phone]. 02/13/17.

## 2017-02-22 ENCOUNTER — Other Ambulatory Visit: Payer: Self-pay | Admitting: Family Medicine

## 2017-02-26 NOTE — Telephone Encounter (Signed)
Last fill was advised that needed to sched appt/denied as no attempt made by pt to sched/thx dmf

## 2017-03-16 ENCOUNTER — Other Ambulatory Visit: Payer: Self-pay | Admitting: Family Medicine

## 2017-03-25 ENCOUNTER — Other Ambulatory Visit: Payer: Self-pay | Admitting: Family Medicine

## 2017-05-20 ENCOUNTER — Other Ambulatory Visit: Payer: Self-pay | Admitting: Family Medicine

## 2018-02-24 ENCOUNTER — Other Ambulatory Visit: Payer: Self-pay | Admitting: Family Medicine

## 2019-02-16 ENCOUNTER — Other Ambulatory Visit: Payer: Self-pay

## 2019-02-16 DIAGNOSIS — Z20822 Contact with and (suspected) exposure to covid-19: Secondary | ICD-10-CM

## 2019-02-17 LAB — NOVEL CORONAVIRUS, NAA: SARS-CoV-2, NAA: NOT DETECTED

## 2019-02-26 ENCOUNTER — Other Ambulatory Visit: Payer: Self-pay

## 2019-02-26 DIAGNOSIS — Z20822 Contact with and (suspected) exposure to covid-19: Secondary | ICD-10-CM

## 2019-03-01 LAB — NOVEL CORONAVIRUS, NAA: SARS-CoV-2, NAA: NOT DETECTED

## 2019-03-13 ENCOUNTER — Other Ambulatory Visit: Payer: Self-pay

## 2019-03-13 ENCOUNTER — Ambulatory Visit: Payer: BC Managed Care – PPO | Attending: Internal Medicine

## 2019-03-13 DIAGNOSIS — Z20822 Contact with and (suspected) exposure to covid-19: Secondary | ICD-10-CM

## 2019-03-14 LAB — NOVEL CORONAVIRUS, NAA: SARS-CoV-2, NAA: NOT DETECTED

## 2019-05-28 ENCOUNTER — Ambulatory Visit: Payer: BC Managed Care – PPO | Attending: Internal Medicine

## 2019-05-28 DIAGNOSIS — Z20822 Contact with and (suspected) exposure to covid-19: Secondary | ICD-10-CM

## 2019-05-29 LAB — NOVEL CORONAVIRUS, NAA: SARS-CoV-2, NAA: NOT DETECTED

## 2019-06-01 ENCOUNTER — Other Ambulatory Visit: Payer: BC Managed Care – PPO

## 2020-02-15 ENCOUNTER — Encounter: Payer: Self-pay | Admitting: Gastroenterology

## 2020-02-16 ENCOUNTER — Telehealth (HOSPITAL_COMMUNITY): Payer: Self-pay | Admitting: Psychiatry

## 2020-02-16 NOTE — Telephone Encounter (Signed)
D:  Pt phoned to inquire about MH-IOP.  According to pt, [name] referred her.  A:  Oriented pt.  Pt states she would like to contact her insurance company first, but signed up for an assessment Mon. 02-22-20 @ 10 a.m.Marland Kitchen  Pt will start MH-IOP on 02-23-20 @ 9 a.m.  R:  Pt receptive.

## 2020-02-22 ENCOUNTER — Other Ambulatory Visit (HOSPITAL_COMMUNITY): Payer: BC Managed Care – PPO | Attending: Psychiatry | Admitting: Family

## 2020-02-22 ENCOUNTER — Other Ambulatory Visit: Payer: Self-pay

## 2020-02-22 ENCOUNTER — Encounter (HOSPITAL_COMMUNITY): Payer: Self-pay | Admitting: Psychiatry

## 2020-02-22 DIAGNOSIS — F418 Other specified anxiety disorders: Secondary | ICD-10-CM

## 2020-02-22 DIAGNOSIS — F17210 Nicotine dependence, cigarettes, uncomplicated: Secondary | ICD-10-CM | POA: Insufficient documentation

## 2020-02-22 DIAGNOSIS — F1010 Alcohol abuse, uncomplicated: Secondary | ICD-10-CM | POA: Insufficient documentation
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
## 2020-02-23 ENCOUNTER — Other Ambulatory Visit: Payer: Self-pay

## 2020-02-23 ENCOUNTER — Other Ambulatory Visit (HOSPITAL_COMMUNITY): Payer: BC Managed Care – PPO | Admitting: Psychiatry

## 2020-02-23 ENCOUNTER — Encounter (HOSPITAL_COMMUNITY): Payer: Self-pay | Admitting: Family

## 2020-02-23 ENCOUNTER — Encounter (HOSPITAL_COMMUNITY): Payer: Self-pay | Admitting: Psychiatry

## 2020-02-23 DIAGNOSIS — F418 Other specified anxiety disorders: Secondary | ICD-10-CM | POA: Diagnosis present

## 2020-02-23 DIAGNOSIS — F17210 Nicotine dependence, cigarettes, uncomplicated: Secondary | ICD-10-CM | POA: Diagnosis not present

## 2020-02-23 DIAGNOSIS — F1010 Alcohol abuse, uncomplicated: Secondary | ICD-10-CM | POA: Diagnosis not present

## 2020-02-23 NOTE — Progress Notes (Signed)
Virtual Visit via Telephone Note  I connected with [name] on 02/23/20 at  9:00 AM EST by telephone and verified that I am speaking with the correct person using two identifiers.  Location: Patient: home Provider: outpatient office   I discussed the limitations, risks, security and privacy concerns of performing an evaluation and management service by telephone and the availability of in person appointments. I also discussed with the patient that there may be a patient responsible charge related to this service. The patient expressed understanding and agreed to proceed.  I discussed the assessment and treatment plan with the patient. The patient was provided an opportunity to ask questions and all were answered. The patient agreed with the plan and demonstrated an understanding of the instructions.   The patient was advised to call back or seek an in-person evaluation if the symptoms worsen or if the condition fails to improve as anticipated.  I provided 15 minutes of non-face-to-face time during this encounter.   Derrill Center, NP    Psychiatric Initial Adult Assessment   Patient Identification: Donna Chambers MRN:  [id number] Date of Evaluation:  02/23/2020 Referral Source: Chucky May Chief Complaint:   Chief Complaint    Depression; Anxiety     Visit Diagnosis:    ICD-10-CM   1. Depression with anxiety  F41.8     History of Present Illness: Donna Chambers 44 year old Caucasian female who presents with worsening depression and anxiety.  States she learned that her husband has been having an affair for the past year.  Patient reports feelings of worthlessness and struggling with low self-esteem and feelings of self-doubt due to her  husband's infidelity.  Reports ongoing ruminations, worsening depression and trust issues. Stated that they have attended couples counseling in the past.     Micaila denied suicidal or homicidal ideations.  Denies auditory or  visual hallucinations.  Denied previous inpatient admissions.  Reports she is currently followed by psychiatry and a therapist.  Reports she is prescribed gabapentin, BuSpar, Lunesta and Trintellix.  Reports she has tried Effexor and Wellbutrin in the past.  Patient reports she is currently employed by the [employer] and states that responsible for other employees, which can be overwhelming at times.  Patient reports unintentional weight loss of 20 pounds over the past 2 months due to symptoms of worry.  States she struggled with alcohol abuse however states she has not drank in a while.    Reported family history of substance abuse and mental illness.  .  States mother: Depression and anxiety states her mother is taking Zoloft.  Reported older sister and younger sister both suffer from mental illness.  Father: Alcohol abuse.-Undiagnosed mental illness. Paternal grandfather: Alcohol abuse, patient to start intensive.Reports a fairly close relationship between she and her younger sister.  Patient reports she is in contact with her mother daily. Graciella to start outpatient programming on 02/23/2020  Associated Signs/Symptoms: Depression Symptoms:  depressed mood, feelings of worthlessness/guilt, difficulty concentrating, anxiety, (Hypo) Manic Symptoms:  Irritable Mood, Anxiety Symptoms:  Excessive Worry, Psychotic Symptoms:  Hallucinations: None PTSD Symptoms: NA  Past Psychiatric History:   Previous Psychotropic Medications: Yes   Substance Abuse History in the last 12 months:  Yes.    Consequences of Substance Abuse: NA  Past Medical History:  Past Medical History:  Diagnosis Date  . Allergy   . Depression   . GERD (gastroesophageal reflux disease)   . Hypertension   . Thyroid disease     Past Surgical History:  Procedure Laterality Date  . KNEE SURGERY Right   . TOOTH EXTRACTION      Family Psychiatric History:   Family History:  Family History  Problem Relation Age of Onset   . Cancer Mother   . Alcohol abuse Father   . Hyperlipidemia Father   . Heart disease Father   . Hypertension Father   . Migraines Sister   . Cancer Paternal Grandmother   . Hyperlipidemia Paternal Grandmother   . Hypertension Paternal Grandmother   . Heart disease Paternal Grandmother     Social History:   Social History   Socioeconomic History  . Marital status: Married    Spouse name: Not on file  . Number of children: Not on file  . Years of education: Not on file  . Highest education level: Not on file  Occupational History  . Not on file  Tobacco Use  . Smoking status: Current Every Day Smoker    Years: 10.00    Types: E-cigarettes  . Smokeless tobacco: Never Used  Substance and Sexual Activity  . Alcohol use: Yes  . Drug use: No  . Sexual activity: Yes    Birth control/protection: I.U.D.  Other Topics Concern  . Not on file  Social History Narrative   One son   Civil engineer, contracting for [employer]   Social Determinants of Health   Financial Resource Strain:   . Difficulty of Paying Living Expenses: Not on file  Food Insecurity:   . Worried About Charity fundraiser in the Last Year: Not on file  . Ran Out of Food in the Last Year: Not on file  Transportation Needs:   . Lack of Transportation (Medical): Not on file  . Lack of Transportation (Non-Medical): Not on file  Physical Activity:   . Days of Exercise per Week: Not on file  . Minutes of Exercise per Session: Not on file  Stress:   . Feeling of Stress : Not on file  Social Connections:   . Frequency of Communication with Friends and Family: Not on file  . Frequency of Social Gatherings with Friends and Family: Not on file  . Attends Religious Services: Not on file  . Active Member of Clubs or Organizations: Not on file  . Attends Archivist Meetings: Not on file  . Marital Status: Not on file    Additional Social History:   Allergies:   Allergies  Allergen Reactions  . Penicillins Hives  .  Vicodin [Hydrocodone-Acetaminophen] Nausea Only    Metabolic Disorder Labs: No results found for: HGBA1C, MPG No results found for: PROLACTIN Lab Results  Component Value Date   CHOL 161 02/15/2016   TRIG 140.0 02/15/2016   HDL 41.40 02/15/2016   CHOLHDL 4 02/15/2016   VLDL 28.0 02/15/2016   LDLCALC 92 02/15/2016   LDLCALC 101 (H) 11/25/2014   Lab Results  Component Value Date   TSH 1.89 02/15/2016    Therapeutic Level Labs: No results found for: LITHIUM No results found for: CBMZ No results found for: VALPROATE  Current Medications: Current Outpatient Medications  Medication Sig Dispense Refill  . amphetamine-dextroamphetamine (ADDERALL) 30 MG tablet Take 30 mg by mouth 2 (two) times daily.    . Eszopiclone 3 MG TABS TAKE 1 TABLET BY MOUTH AT BEDTIME. MUST SCHEDULE OFFICE VISIT FOR MORE REFILLS 15 tablet 0  . vortioxetine HBr (TRINTELLIX) 20 MG TABS tablet Take 20 mg by mouth daily.    Marland Kitchen ALPRAZolam (XANAX) 0.5 MG tablet TAKE ONE  TABLET BY MOUTH 2 TO 3 TIMES DAILY 90 tablet 0  . Fish Oil-Cholecalciferol (FISH OIL + D3 PO) Take by mouth.    . gabapentin (NEURONTIN) 600 MG tablet TAKE 1 TABLET BY MOUTH 3 TIMES DAILY 270 tablet 1  . metoprolol succinate (TOPROL-XL) 50 MG 24 hr tablet TAKE ONE TABLET BY MOUTH EVERY DAY. **MUST HAVE OFFICE VISIT FOR FUTURE REFILLS** 90 tablet 3  . Misc Natural Products (TART CHERRY ADVANCED PO) Take by mouth.    . Multiple Vitamin (MULTIVITAMIN) capsule Take 4 capsules by mouth daily.    Marland Kitchen omeprazole (PRILOSEC) 40 MG capsule TAKE 1 CAPSULE BY MOUTH EVERY DAY 90 capsule 3   No current facility-administered medications for this visit.    Musculoskeletal:   Psychiatric Specialty Exam: Review of Systems  There were no vitals taken for this visit.There is no height or weight on file to calculate BMI.  General Appearance: Casual  Eye Contact:  Good  Speech:  Clear and Coherent  Volume:  Normal  Mood:  Anxious and Depressed  Affect:   Congruent  Thought Process:  Coherent  Orientation:  Full (Time, Place, and Person)  Thought Content:  Logical  Suicidal Thoughts:  No  Homicidal Thoughts:  No  Memory:  Immediate;   Fair Recent;   Fair  Judgement:  Fair  Insight:  Good  Psychomotor Activity:  Normal  Concentration:  Concentration: Fair  Recall:  [employer] of Knowledge:Fair  Language: Fair  Akathisia:  No  Handed:  Right  AIMS (if indicated):    Assets:  Communication Skills Resilience Social Support  ADL's:  Intact  Cognition: WNL  Sleep:  Fair   Screenings:   Assessment and Plan:   Start Intensive Outpatient programming -Continue medications as directed   Treatment plan was reviewed and agreed upon by NP T.Bobby Rumpf and patient Jeweliana Dudgeon need for group services   Derrill Center, NP 11/30/202110:03 AM

## 2020-02-23 NOTE — Progress Notes (Signed)
Virtual Visit via Video Note  I connected with Donna Chambers on 02/23/20 at  9:00 AM EST by a video enabled telemedicine application and verified that I am speaking with the correct person using two identifiers.  At orientation to the IOP program, Case Manager discussed the limitations of evaluation and management by telemedicine and the availability of in person appointments. The patient expressed understanding and agreed to proceed with virtual visits throughout the duration of the program.   Location:  Patient: Patient Home Provider: Home Office   History of Present Illness: Depression with Anxiety  Observations/Objective: Check In: Case Manager checked in with all participants to review discharge dates, insurance authorizations, work-related documents and needs from the treatment team. Client stated needs and engaged in discussion. Counselor facilitated a group processing with group members to assess mood and current functioning. Today being th Client's first session, she shared about need for treatment, current life stressors, about support system and expectations for treatment outcomes, as the group welcomed and introduced selves as well. Client disclosed about marital challenges, work life balance, and need for personal growth and exploration. Client reports having "nervous energy", being "talkative", fidgets, restless, anxious, depressed and antsy.  Client presents with moderate depression and severe anxiety. Client denied any current SI/HI/psychosis.   Initial Therapeutic Activity: Counselor facilitated discuss regarding how mental health diagnosis/conditions have impacted Group Members relationship with significant others, family members and friends. Group members shared about challenges in communicating and advocating for their mental health needs. Counselor provided feedback and strategies in balancing relationships and connections with others while caring for self. Client noted that  she has a mental health condition since she was very young, due to family dynamics and hereditary factors. Client shared how the way she function impacts closeness in her marriage.  Second Therapeutic Activity: Counselor provided psychoeducation on personal and social identities. Group members expressed concerns about "not knowing themselves" and experiences with losing/grieving pieces of their identity. Counselor provided and prompted group members to complete activities on two worksheets. Client shared that she has become disconnected with self over the past few years and would like to do more self-exploration using these tools outside of group sessions. Counselor sent 7 additional self-assessments for group members to complete for homework connected with self-exploration.                                                                                                                                               Check Out: Counselor prompted group members to share their plans for implementing self-care or to engage in a productivity activity. Client plans to journal about experience and feelings to better manage mental health symptoms today. Client indicated that they are safe, will utilize safety plan and return to next session of treatment program.   Assessment and Plan: Clinician recommends that Client remain in IOP treatment to better  manage mental health symptoms, stabilization and to address treatment plan goals. Clinician recommends adherence to crisis/safety plan, taking medications as prescribed, and following up with medical professionals if any issues arise.   Follow Up Instructions: Clinician will send Webex link for next session. The Client was advised to call back or seek an in-person evaluation if the symptoms worsen or if the condition fails to improve as anticipated.     I provided 180 minutes of non-face-to-face time during this encounter.     Lise Auer, LCSW

## 2020-02-24 ENCOUNTER — Other Ambulatory Visit (HOSPITAL_COMMUNITY): Payer: BC Managed Care – PPO | Attending: Psychiatry | Admitting: Psychiatry

## 2020-02-24 ENCOUNTER — Other Ambulatory Visit: Payer: Self-pay

## 2020-02-24 ENCOUNTER — Encounter (HOSPITAL_COMMUNITY): Payer: Self-pay

## 2020-02-24 DIAGNOSIS — F32A Depression, unspecified: Secondary | ICD-10-CM | POA: Insufficient documentation

## 2020-02-24 DIAGNOSIS — F419 Anxiety disorder, unspecified: Secondary | ICD-10-CM | POA: Insufficient documentation

## 2020-02-24 DIAGNOSIS — F418 Other specified anxiety disorders: Secondary | ICD-10-CM | POA: Diagnosis present

## 2020-02-24 NOTE — Progress Notes (Signed)
Virtual Visit via Video Note  I connected with Donna Chambers on 02/24/20 at  9:00 AM EST by a video enabled telemedicine application and verified that I am speaking with the correct person using two identifiers.  Location:  Patient: Patient Home Provider: Home Office   History of Present Illness: Depression and Anxiety  Observations/Objective: Check In: Case Manager checked in with all participants to review discharge dates, insurance authorizations, work-related documents and needs from the treatment team. Client stated needs and engaged in discussion. Counselor facilitated a group processing with group members to assess mood and current functioning. Client reports that she felt a sense of relief and improved mood since our session yesterday. Client believes she is in the right treatment for her now. Client stated she experienced the "blues" and anxiety yesterday, but felt more equipped to cope. Client discussed work related stressors. Client presents with moderate depression and severe anxiety. Client denied any current SI/HI/psychosis.   Initial Therapeutic Activity: Counselor introduced our guest speaker, Einar Grad, [facility] Pharmacist, who shared about psychiatric medications, side effects, treatment considerations and how to communicate with medical professionals. Group Members asked specific questions and shared medication concerns.   Second Therapeutic Activity: Counselor prompted group members to reference a worksheet called, "Body Scan" to jot down questions and concerns about their physical health in preparation for their upcoming appointments with medical professionals. Client noted need to follow up with providers for headaches/migraines and allergy relief. Counselor encouraged routine medical check-ups, preparing for appointments, following up with recommendations and seeking specialist if needed.   Check Out: Counselor prompted group members to share their plans for implementing  self-care or to engage in a productivity activity. Client plans to try a meditation to better manage mental health symptoms today. Client indicated that they are safe, will utilize safety plan and return to next session of treatment program.   Assessment and Plan: Clinician recommends that Client remain in IOP treatment to better manage mental health symptoms, stabilization and to address treatment plan goals. Clinician recommends adherence to crisis/safety plan, taking medications as prescribed, and following up with medical professionals if any issues arise.   Follow Up Instructions: Clinician will send Webex link for next session. The Client was advised to call back or seek an in-person evaluation if the symptoms worsen or if the condition fails to improve as anticipated.     I provided 180 minutes of non-face-to-face time during this encounter.     Lise Auer, LCSW

## 2020-02-25 ENCOUNTER — Other Ambulatory Visit: Payer: Self-pay

## 2020-02-25 ENCOUNTER — Encounter (HOSPITAL_COMMUNITY): Payer: Self-pay | Admitting: Psychiatry

## 2020-02-25 ENCOUNTER — Other Ambulatory Visit (HOSPITAL_COMMUNITY): Payer: BC Managed Care – PPO | Admitting: Psychiatry

## 2020-02-25 DIAGNOSIS — F418 Other specified anxiety disorders: Secondary | ICD-10-CM

## 2020-02-25 DIAGNOSIS — F32A Depression, unspecified: Secondary | ICD-10-CM | POA: Diagnosis not present

## 2020-02-25 NOTE — Progress Notes (Signed)
Virtual Visit via Video Note  I connected with Donna Chambers on 02/25/20 at  9:00 AM EST by a video enabled telemedicine application and verified that I am speaking with the correct person using two identifiers.  At orientation to the IOP program, Case Manager discussed the limitations of evaluation and management by telemedicine and the availability of in person appointments. The patient expressed understanding and agreed to proceed with virtual visits throughout the duration of the program.   Location:  Patient: Patient Home Provider: Home Office   History of Present Illness: Depression and Anxiety  Observations/Objective: Check In: Case Manager checked in with all participants to review discharge dates, insurance authorizations, work-related documents and needs from the treatment team. Client stated needs and engaged in discussion. Counselor facilitated a group processing with group members to assess mood and current functioning. Client reports that she was able to pause for a moment to ground and meditate, with postive outcomes. Client presents with moderate depression and severe anxiety. Client denied any current SI/HI/psychosis.   Initial Therapeutic Activity: Counselor introduced [employer], [place] Chaplain to present information and discussion on Grief and Loss. Group members engaged in discussion, sharing how grief impacts them, what comforts them, what emotions are felt, labeling losses, etc. After guest speaker logged off, Counselor prompted group to spend 10-15 minutes journaling to process personal grief and loss situations. Counselor processed entries with group and client's identified areas for additional processing in individual therapy. Client noted confidence, self-esteem issues, loss of identity and how she is experiencing grief at this time.  Second Therapeutic Activity: Counselor presented psychoeducation on the Types of Grief, including disenfranchised, complicated,  chronic, masked and secondary grief. Group members asked questions, applied concepts to personal experiences and noted the helpfulness in having words to better describe their grief. Counselor shared a video on resiliency and covered 8 steps to emotional healing, identifying practical tasks clients can consider in their healing process. Client stated that she most identifies with disenfranchised grief.  Check Out: Counselor prompted group members to share their plans for implementing self-care or to engage in a productivity activity. Client plans to journal to better manage mental health symptoms today. Client indicated that they are safe, will utilize safety plan and return to next session of treatment program.   Assessment and Plan: Clinician recommends that Client remain in IOP treatment to better manage mental health symptoms, stabilization and to address treatment plan goals. Clinician recommends adherence to crisis/safety plan, taking medications as prescribed, and following up with medical professionals if any issues arise.   Follow Up Instructions: Clinician will send Webex link for next session. The Client was advised to call back or seek an in-person evaluation if the symptoms worsen or if the condition fails to improve as anticipated.     I provided 180 minutes of non-face-to-face time during this encounter.     Lise Auer, LCSW

## 2020-02-26 ENCOUNTER — Other Ambulatory Visit: Payer: Self-pay

## 2020-02-26 ENCOUNTER — Other Ambulatory Visit (HOSPITAL_COMMUNITY): Payer: BC Managed Care – PPO

## 2020-02-29 ENCOUNTER — Other Ambulatory Visit (HOSPITAL_COMMUNITY): Payer: BC Managed Care – PPO | Admitting: Psychiatry

## 2020-02-29 ENCOUNTER — Other Ambulatory Visit: Payer: Self-pay

## 2020-02-29 ENCOUNTER — Encounter (HOSPITAL_COMMUNITY): Payer: Self-pay | Admitting: Psychiatry

## 2020-02-29 DIAGNOSIS — F32A Depression, unspecified: Secondary | ICD-10-CM | POA: Diagnosis not present

## 2020-02-29 DIAGNOSIS — F418 Other specified anxiety disorders: Secondary | ICD-10-CM

## 2020-02-29 NOTE — Progress Notes (Signed)
Virtual Visit via Video Note  I connected with Donna Chambers on 02/29/20 at  9:00 AM EST by a video enabled telemedicine application and verified that I am speaking with the correct person using two identifiers.  At orientation to the IOP program, Case Manager discussed the limitations of evaluation and management by telemedicine and the availability of in person appointments. The patient expressed understanding and agreed to proceed with virtual visits throughout the duration of the program.   Location:  Patient: Patient Home Provider: Home Office   History of Present Illness:   Observations/Objective: Check In: Case Manager checked in with all participants to review discharge dates, insurance authorizations, work-related documents and needs from the treatment team. Client stated needs and engaged in discussion. Counselor facilitated a group processing with group members to assess mood and current functioning. Client reports that she had a positive session with her individual therapist. The Client would like to work on forgiveness towards husband and self related to marital issues. Client stated that she is having emotional difficulties with setting boundaries with husband, as he is minimizing the impact of his actions on their relationship. Client processed thoughts and feelings, receiving feedback from group members and validation. Client presents with moderate depression and moderate anxiety. Client denied any current SI/HI/psychosis.   Initial Therapeutic Activity: Counselor engaged the group in naming/identifying the ways in which they contribute to society, volunteer, give back and engage in community groups within their lives. Counselor provided psychoeducation of the positive correlations on altruism with improved mood and mental health. Group members shared about connections with their faith communities, holiday giving programs and places they have volunteered in the past, sharing  positive experiences and outcomes. Counselor challenged group members, in light of pandemic status, to find safe and consistent ways to give back and engage with programs or groups in their area.  Second Therapeutic Activity: Counselor presented information on Cognitive Distortions. Client shared a video outlining 15 styles of cognitive distortions. Group members shared which ones they do most in their thought processes. Client connected with most types, specifically asking questions about catastrophizing and labeling. Counselor shared 5 strategies for combating distorted thinking. Counselor shared links for resources for Group Members to review for homework.  Check Out: Counselor prompted group members to share their plans for implementing self-care or to engage in a productivity activity. Client plans to read over therapy handouts and to complete homework to better manage mental health symptoms today. Client indicated that they are safe, will utilize safety plan and return to next session of treatment program.   Assessment and Plan: Clinician recommends that Client remain in IOP treatment to better manage mental health symptoms, stabilization and to address treatment plan goals. Clinician recommends adherence to crisis/safety plan, taking medications as prescribed, and following up with medical professionals if any issues arise.   Follow Up Instructions: Clinician will send Webex link for next session. The Client was advised to call back or seek an in-person evaluation if the symptoms worsen or if the condition fails to improve as anticipated.     I provided 180 minutes of non-face-to-face time during this encounter.     Lise Auer, LCSW

## 2020-03-01 ENCOUNTER — Other Ambulatory Visit (HOSPITAL_COMMUNITY): Payer: BC Managed Care – PPO | Admitting: Psychiatry

## 2020-03-01 ENCOUNTER — Encounter (HOSPITAL_COMMUNITY): Payer: Self-pay | Admitting: Psychiatry

## 2020-03-01 ENCOUNTER — Other Ambulatory Visit: Payer: Self-pay

## 2020-03-01 DIAGNOSIS — F418 Other specified anxiety disorders: Secondary | ICD-10-CM

## 2020-03-01 DIAGNOSIS — F32A Depression, unspecified: Secondary | ICD-10-CM | POA: Diagnosis not present

## 2020-03-01 NOTE — Progress Notes (Signed)
Virtual Visit via Video Note  I connected with Donna Chambers on 03/01/20 at  9:00 AM EST by a video enabled telemedicine application and verified that I am speaking with the correct person using two identifiers.  At orientation to the IOP program, Case Manager discussed the limitations of evaluation and management by telemedicine and the availability of in person appointments. The patient expressed understanding and agreed to proceed with virtual visits throughout the duration of the program.   Location:  Patient: Patient Home Provider: Home Office   History of Present Illness: Depression and Anxiety  Observations/Objective: Check In: Case Manager checked in with all participants to review discharge dates, insurance authorizations, work-related documents and needs from the treatment team. Client stated needs and engaged in discussion. Counselor facilitated a group processing with group members to assess mood and current functioning. Client reports that worked to advocate for her mental health needs with her administrators at work. Client reports being sad and disappointed with the outcomes, as she will not be able to take time from work to focus solely on treatment. Client reports overall improvement in mood and functioning through application of skills learned in sessions. Client presents with moderate depression and severe anxiety. Client denied any current SI/HI/psychosis.   Initial Therapeutic Activity: Counselor engaged group in psychoeducational discussion about how spiritual/faith/religious practices have positive impacts on mental health outcomes. Counselor shared information from recent research completed on the topic, as group members commented and shared their personal experiences in how their mental health and spiritual health intertwine. Client shared about her faith in Donna Chambers, praying, listening to sermons and encouraging messages/music. Client learning to practice meditations and is  reading the book, The Power of Now about being more present and mindful to reduce anxiety.  Second Therapeutic Activity: Counselor presented 2 worksheets on "Thought Challenging", one for anxiety and one for depression. Counselor shared instructions and walked group members through instructions. Counselor allowed time for group to complete and report back on their work and reflections. Client processed the scenario of discussing mental health needs with boss, role playing alternative thoughts and messaging she can give herself in the future.  Check Out: Counselor prompted group members to share their plans for implementing self-care or to engage in a productivity activity. Client plans to focus on self-care and hygiene before going to work to better manage mental health symptoms today. Client indicated that they are safe, will utilize safety plan and return to next session of treatment program.   Assessment and Plan: Clinician recommends that Client remain in IOP treatment to better manage mental health symptoms, stabilization and to address treatment plan goals. Clinician recommends adherence to crisis/safety plan, taking medications as prescribed, and following up with medical professionals if any issues arise.   Follow Up Instructions: Clinician will send Webex link for next session. The Client was advised to call back or seek an in-person evaluation if the symptoms worsen or if the condition fails to improve as anticipated.     I provided 180 minutes of non-face-to-face time during this encounter.     Lise Auer, LCSW

## 2020-03-02 ENCOUNTER — Other Ambulatory Visit: Payer: Self-pay

## 2020-03-02 ENCOUNTER — Other Ambulatory Visit (HOSPITAL_COMMUNITY): Payer: BC Managed Care – PPO | Admitting: Psychiatry

## 2020-03-02 ENCOUNTER — Encounter (HOSPITAL_COMMUNITY): Payer: Self-pay

## 2020-03-02 DIAGNOSIS — F418 Other specified anxiety disorders: Secondary | ICD-10-CM

## 2020-03-02 DIAGNOSIS — F32A Depression, unspecified: Secondary | ICD-10-CM | POA: Diagnosis not present

## 2020-03-02 NOTE — Progress Notes (Unsigned)
Virtual Visit via Video Note  I connected with Donna Chambers on 03/02/20 at  9:00 AM EST by a video enabled telemedicine application and verified that I am speaking with the correct person using two identifiers.  At orientation to the IOP program, Case Manager discussed the limitations of evaluation and management by telemedicine and the availability of in person appointments. The patient expressed understanding and agreed to proceed with virtual visits throughout the duration of the program.   Location:  Patient: Patient Home Provider: Home Office   History of Present Illness: Depression with Axiety  Observations/Objective: Check In: Case Manager checked in with all participants to review discharge dates, insurance authorizations, work-related documents and needs from the treatment team. Client stated needs and engaged in discussion. Counselor facilitated a group processing with group members to assess mood and current functioning. Client reports that she used colored to reduce anxiety and self-regulate. Client states that she feels "good but tired" after sessions. Client presents with moderate depression and moderate anxiety. Client denied any current SI/HI/psychosis.    Initial Therapeutic Activity: Counselor introduced Donna Chambers, representative with [employer] to share about programming. Group Members asked questions and engaged in discussion, as Donna Chambers shared about Peer Support, Support Groups and the [employer]. Client stated that they are interested in connecting with the Women's Emotional Support Group, [employer] Program and [employer] [facility].   Second Therapeutic Activity: Counselor prompted group members to share coping skills and resources they personally use to manage/stabilize/improve their mental health. Group members shared useful apps, websites, sensory items, books and ideas with group. Client participated in sharing and discussion. Counselor provided  group with an exhaustive list of community, state and national mental health, substance abuse and basic need resources. Counselor prompted group members to save numbers in their phones and to bookmark links for future use.   Check Out: Counselor prompted each group member to share one resource they will utilize or check in to over the next week. Client shared that they were most interested in meditation apps and resources.  Assessment and Plan: Clinician recommends that Client remain in IOP treatment to better manage mental health symptoms, stabilization and to address treatment plan goals. Clinician recommends adherence to crisis/safety plan, taking medications as prescribed, and following up with medical professionals if any issues arise.   Follow Up Instructions: Clinician will send Webex link for next session. The Client was advised to call back or seek an in-person evaluation if the symptoms worsen or if the condition fails to improve as anticipated.     I provided 180 minutes of non-face-to-face time during this encounter.     Lise Auer, LCSW

## 2020-03-03 ENCOUNTER — Other Ambulatory Visit (HOSPITAL_COMMUNITY): Payer: BC Managed Care – PPO | Admitting: Psychiatry

## 2020-03-03 ENCOUNTER — Encounter (HOSPITAL_COMMUNITY): Payer: Self-pay

## 2020-03-03 ENCOUNTER — Other Ambulatory Visit: Payer: Self-pay

## 2020-03-03 DIAGNOSIS — F32A Depression, unspecified: Secondary | ICD-10-CM | POA: Diagnosis not present

## 2020-03-03 DIAGNOSIS — F418 Other specified anxiety disorders: Secondary | ICD-10-CM

## 2020-03-03 NOTE — Progress Notes (Signed)
Virtual Visit via Video Note  I connected with [name] on 03/03/20 at  9:00 AM EST by a video enabled telemedicine application and verified that I am speaking with the correct person using two identifiers.  At orientation to the IOP program, Case Manager discussed the limitations of evaluation and management by telemedicine and the availability of in person appointments. The patient expressed understanding and agreed to proceed with virtual visits throughout the duration of the program.   Location:  Patient: Patient Home Provider: Home Office   History of Present Illness: MDD  Observations/Objective: Check In: Case Manager checked in with all participants to review discharge dates, insurance authorizations, work-related documents and needs from the treatment team. Client stated needs and engaged in discussion. Counselor facilitated a group processing with group members to assess mood and current functioning. Client reports that she was tired and finding that doing group and working in the afternoons to be very challenging. Client is working to complete [employer] paper work with employer to alleviate stress. Client presents with moderate depression and moderate anxiety. Client denied any current SI/HI/psychosis.    Initial Therapeutic Activity: Counselor introduced Rolin Barry, [place] Chaplain to present information and discussion on Grief and Loss. Group members engaged in discussion, sharing how grief impacts them, what comforts them, what emotions are felt, labeling losses, etc. After guest speaker logged off, Counselor prompted group to spend 10-15 minutes journaling to process personal grief and loss situations. Counselor processed entries with group and client's identified areas for additional processing in individual therapy. Client noted grief work related to husbands infidelity and how it has "impacted and touched every aspect of her life."   Second Therapeutic Activity: Counselor  introduced books, videos and resources by [employer] related to the power of vulnerability, guilt, shame, self-worth and whole-heartedness, as this is a topic that has been reoccurring for current group members. Counselor allowed time for group members to process their experience and understanding of vulnerability, then Counselor provided a TedTalk of [employer] for group members to take notes and view. Counselor prompted group members to share their takeaways. Client stated that growing up she was taught that vulnerability is bad and unacceptable. Client would like to learn more on this topic through resources provided.  Check Out: Counselor prompted each group member to share one self-care practice or one productivity activity they can apply between now and next session. Client stated she plans to journal about feelings. Client endorsed safety plan to be followed to prevent safety issues.  Assessment and Plan: Clinician recommends that Client remain in IOP treatment to better manage mental health symptoms, stabilization and to address treatment plan goals. Clinician recommends adherence to crisis/safety plan, taking medications as prescribed, and following up with medical professionals if any issues arise.   Follow Up Instructions: Clinician will send Webex link for next session. The Client was advised to call back or seek an in-person evaluation if the symptoms worsen or if the condition fails to improve as anticipated.     I provided 180 minutes of non-face-to-face time during this encounter.     Lise Auer, LCSW

## 2020-03-04 ENCOUNTER — Encounter (HOSPITAL_COMMUNITY): Payer: Self-pay

## 2020-03-04 ENCOUNTER — Other Ambulatory Visit (HOSPITAL_COMMUNITY): Payer: BC Managed Care – PPO | Admitting: Psychiatry

## 2020-03-04 ENCOUNTER — Other Ambulatory Visit: Payer: Self-pay

## 2020-03-04 DIAGNOSIS — F32A Depression, unspecified: Secondary | ICD-10-CM | POA: Diagnosis not present

## 2020-03-04 DIAGNOSIS — F418 Other specified anxiety disorders: Secondary | ICD-10-CM

## 2020-03-04 NOTE — Progress Notes (Signed)
Virtual Visit via Video Note  I connected with Donna Chambers on 03/04/20 at  9:00 AM EST by a video enabled telemedicine application and verified that I am speaking with the correct person using two identifiers.  At orientation to the IOP program, Case Manager discussed the limitations of evaluation and management by telemedicine and the availability of in person appointments. The patient expressed understanding and agreed to proceed with virtual visits throughout the duration of the program.   Location:  Patient: Patient Home Provider: Home Office   History of Present Illness: Depression with Anxiety  Observations/Objective: Check In: Case Manager checked in with all participants to review discharge dates, insurance authorizations, work-related documents and needs from the treatment team. Client stated needs and engaged in discussion. Counselor facilitated a group processing with group members to assess mood and current functioning. Client reports that she does not feel understood, heard or validated by her support system. Client and Counselor worked to identify ways she can express her needs differently and to set healthier boundaries. Client presents with severe depression and severe anxiety. Client denied any current SI/HI/psychosis.    Initial Therapeutic Activity: Counselor shared psychoeducation on finding motivation to move forward in achieving life goals. Counselor prompted group members to identify areas of lack in motivation that are impacting their mental health and personal goals. Client noted that she feels stuck in how to move forward in her marital relationship. Client noted barriers to being motivated in taking action or making decisions. Counselor provided strategies and skills in overcoming issues with motivation, vision, purpose and drive, particularly for adults with ADHD. Group engaged in discussion and took note of strategies they plan on applying.    Second Therapeutic  Activity: Counselor engaged group in self-care skills and practices they can implement in buffering the effects of the emotional and mental work they are engaged in through group therapy treatment. Counselor provided psychoeducation on application of skills and "thinking out of the box" ideas. Client particularly resonated with learning more about grief work and how to express self better.  Check Out: Counselor prompted each group member to share one takeaway from today's session that they plan on applying or looking more into. Client stated she plans to purchase a book of grief and loss after infidelity and to look into more ADHD resources. Client endorsed safety plan to be followed to prevent safety issues.  Assessment and Plan: Clinician recommends that Client remain in IOP treatment to better manage mental health symptoms, stabilization and to address treatment plan goals. Clinician recommends adherence to crisis/safety plan, taking medications as prescribed, and following up with medical professionals if any issues arise.   Follow Up Instructions: Clinician will send Webex link for next session. The Client was advised to call back or seek an in-person evaluation if the symptoms worsen or if the condition fails to improve as anticipated.     I provided 180 minutes of non-face-to-face time during this encounter.     Lise Auer, LCSW

## 2020-03-07 ENCOUNTER — Encounter (HOSPITAL_COMMUNITY): Payer: Self-pay

## 2020-03-07 ENCOUNTER — Other Ambulatory Visit (HOSPITAL_COMMUNITY): Payer: BC Managed Care – PPO | Admitting: Psychiatry

## 2020-03-07 ENCOUNTER — Other Ambulatory Visit: Payer: Self-pay

## 2020-03-07 DIAGNOSIS — F418 Other specified anxiety disorders: Secondary | ICD-10-CM

## 2020-03-07 DIAGNOSIS — F32A Depression, unspecified: Secondary | ICD-10-CM | POA: Diagnosis not present

## 2020-03-07 NOTE — Progress Notes (Signed)
Virtual Visit via Video Note  I connected with Donna Chambers on 03/07/20 at  9:00 AM EST by a video enabled telemedicine application and verified that I am speaking with the correct person using two identifiers.  At orientation to the IOP program, Case Manager discussed the limitations of evaluation and management by telemedicine and the availability of in person appointments. The patient expressed understanding and agreed to proceed with virtual visits throughout the duration of the program.   Location:  Patient: Patient Home Provider: Home Office   History of Present Illness: Depression and Anxiety  Observations/Objective: Check In: Case Manager checked in with all participants to review discharge dates, insurance authorizations, work-related documents and needs from the treatment team. Client stated needs and engaged in discussion.     Initial Therapeutic Activity: Counselor facilitated a group processing with group members to assess mood and current functioning. Client reports that she had mixed emotions and symptoms this weekend, with Saturday feeling good and stable, but emotional, depressed and tearful on Sunday. Client discussed efforts to communicate feelings and boundaries with spouse with negative outcomes. Client reports feeling unimportant, unheard and invalidated. Client processed emotions and received feedback and encouragement from the group, helping to feel heard. Client presents with moderate depression and moderate anxiety. Client denied any current SI/HI/psychosis.   Second Therapeutic Activity: Counselor engaged group in [employer] activity, prompting group members to create a visual representation or image of what they experience internally versus what they attempt to portray to others outwardly. Counselor provided time for the group to think, reflect and complete activity before prompting them to individually share images and explain concepts created. Client shared  3 quotes with hidden messages or illusions that represent the hurt and efforts to hid the impact of infidelity in her marriage.   Check Out: Counselor closed program by allowing time to celebrate a graduating group member. Counselor shared reflections on progress and allow space for group members to share well wishes and appreciates to the graduating client. Counselor prompted graduating client to share takeaways, reflect on progress and final thoughts for the group. Client endorsed safety plan to be followed to prevent safety issues.  Assessment and Plan: Clinician recommends that Client remain in IOP treatment to better manage mental health symptoms, stabilization and to address treatment plan goals. Clinician recommends adherence to crisis/safety plan, taking medications as prescribed, and following up with medical professionals if any issues arise.   Follow Up Instructions: Clinician will send Webex link for next session. The Client was advised to call back or seek an in-person evaluation if the symptoms worsen or if the condition fails to improve as anticipated.     I provided 180 minutes of non-face-to-face time during this encounter.     Lise Auer, LCSW

## 2020-03-08 ENCOUNTER — Other Ambulatory Visit (HOSPITAL_COMMUNITY): Payer: BC Managed Care – PPO

## 2020-03-08 ENCOUNTER — Other Ambulatory Visit: Payer: Self-pay

## 2020-03-09 ENCOUNTER — Other Ambulatory Visit: Payer: Self-pay

## 2020-03-09 ENCOUNTER — Other Ambulatory Visit (HOSPITAL_COMMUNITY): Payer: BC Managed Care – PPO | Admitting: Psychiatry

## 2020-03-09 ENCOUNTER — Encounter (HOSPITAL_COMMUNITY): Payer: Self-pay

## 2020-03-09 DIAGNOSIS — F32A Depression, unspecified: Secondary | ICD-10-CM | POA: Diagnosis not present

## 2020-03-09 DIAGNOSIS — F418 Other specified anxiety disorders: Secondary | ICD-10-CM

## 2020-03-09 NOTE — Progress Notes (Signed)
Virtual Visit via Video Note  I connected with Donna Chambers on 03/09/20 at  9:00 AM EST by a video enabled telemedicine application and verified that I am speaking with the correct person using two identifiers.  At orientation to the IOP program, Case Manager discussed the limitations of evaluation and management by telemedicine and the availability of in person appointments. The patient expressed understanding and agreed to proceed with virtual visits throughout the duration of the program.   Location:  Patient: Patient Home Provider: Home Office   History of Present Illness: MDD  Observations/Objective: Check In: Case Manager checked in with all participants to review discharge dates, insurance authorizations, work-related documents and needs from the treatment team. Client stated needs and engaged in discussion. Counselor facilitated a group processing with group members to assess mood and current functioning. Client reports that she is feeling well because she was approved to work from home while in the program, which is alleviating much anxiety. Client discussed current life stressors and issues with trauma reminders/responses.  Client presents with moderate depression and moderate anxiety. Client denied any current SI/HI/psychosis.    Initial Therapeutic Activity: Counselor introduced our guest speaker, Einar Grad, [facility] Pharmacist, who shared about psychiatric medications, side effects, treatment considerations and how to communicate with medical professionals. Group Members asked questions and shared medication concerns. Client asked about side effects, best time to take medications, indications for different symptoms and in relation to others in family with mental health. Counselor encouraged routine medical check-ups, preparing for appointments, following up with recommendations and seeking specialist if needed.     Second Therapeutic Activity: Counselor engaged group in an  activity suggested by the guest speaker. Counselor provided group with calendar templates. Counselor prompted group members to choose 3 symptoms that they would like to track at home in order to share data with providers over a month span. Counselor provided ideas for creating a scaling/tracking system for group to create their own legend. Counselor prompted group members to share what they came up with. Client shared that she wants to document different anxiety triggers and severity in one journal, then to have depression levels and triggers in another journal.  Check Out: Counselor closed group by checking in with Clients to determine what self-care practice or productivity activity they can engage in today to alleviate stress/anxiety. Client plans to work on getting items off her to do list and to spend 5-10 minutes meditating. Client endorsed safety plan to be followed to prevent safety issues.  Assessment and Plan: Clinician recommends that Client remain in IOP treatment to better manage mental health symptoms, stabilization and to address treatment plan goals. Clinician recommends adherence to crisis/safety plan, taking medications as prescribed, and following up with medical professionals if any issues arise.   Follow Up Instructions: Clinician will send Webex link for next session. The Client was advised to call back or seek an in-person evaluation if the symptoms worsen or if the condition fails to improve as anticipated.     I provided 180 minutes of non-face-to-face time during this encounter.     Lise Auer, LCSW

## 2020-03-10 ENCOUNTER — Other Ambulatory Visit: Payer: Self-pay

## 2020-03-10 ENCOUNTER — Encounter (HOSPITAL_COMMUNITY): Payer: Self-pay

## 2020-03-10 ENCOUNTER — Other Ambulatory Visit (HOSPITAL_COMMUNITY): Payer: BC Managed Care – PPO | Admitting: Psychiatry

## 2020-03-10 DIAGNOSIS — F32A Depression, unspecified: Secondary | ICD-10-CM | POA: Diagnosis not present

## 2020-03-10 DIAGNOSIS — F418 Other specified anxiety disorders: Secondary | ICD-10-CM

## 2020-03-10 NOTE — Progress Notes (Signed)
Virtual Visit via Video Note  I connected with [name] on 03/10/20 at  9:00 AM EST by a video enabled telemedicine application and verified that I am speaking with the correct person using two identifiers.  At orientation to the IOP program, Case Manager discussed the limitations of evaluation and management by telemedicine and the availability of in person appointments. The patient expressed understanding and agreed to proceed with virtual visits throughout the duration of the program.   Location:  Patient: Patient Home Provider: Home Office   History of Present Illness: Depression and Anxiety  Observations/Objective: Check In: Case Manager checked in with all participants to review discharge dates, insurance authorizations, work-related documents and needs from the treatment team. Client stated needs and engaged in discussion. Counselor facilitated a group processing with group members to assess mood and current functioning. Client reports that she is doing "ok" today. Client was triggered 2-3 times during session today as difficult content was discussed by other group members. 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 Client was teary throughout session. Client able to self-regulate and process emotional triggers verbally. Client presents with moderate depression and moderate anxiety. Client denied any current SI/HI/psychosis.    Initial Therapeutic Activity: Counselor introduced [employer], [place] Chaplain to present information and discussion on Grief and Loss. Group members engaged in discussion, sharing how grief impacts them, what comforts them, what emotions are felt, labeling losses, etc. After guest speaker logged off, Counselor prompted group to spend 10-15 minutes journaling to process personal grief and loss situations. Counselor processed entries with group and client's identified areas for additional processing in individual therapy. Client noted  anticipatory grief, coping with anger and anxiety, and guilt when taking time to grieve.     Second Therapeutic Activity: Counselor engaged group in the topic Self-Compassion, as it came up with in the context of allowing self to grieve. Counselor provided a link for group to take a self-compassion self-assessment. Client scored a 2.18 out of 5, stating that she is self-critical and has a difficult time sitting with and attending to personal feelings, but is well verse in caring for others Client would like to understand and learn more on self-kindness. Counselor provided psychoeducation on topic using leading research by Dr. [name].   Check Out: Counselor closed group by checking in with Clients to determine what self-care practice or productivity activity they can engage in today to alleviate stress/anxiety. Client plans to do one of the self-compassion meditations on the websites shared today. Client endorsed safety plan to be followed to prevent safety issues.  Assessment and Plan: Clinician recommends that Client remain in IOP treatment to better manage mental health symptoms, stabilization and to address treatment plan goals. Clinician recommends adherence to crisis/safety plan, taking medications as prescribed, and following up with medical professionals if any issues arise.   Follow Up Instructions: Clinician will send Webex link for next session. The Client was advised to call back or seek an in-person evaluation if the symptoms worsen or if the condition fails to improve as anticipated.     I provided 180 minutes of non-face-to-face time during this encounter.     Lise Auer, LCSW

## 2020-03-11 ENCOUNTER — Encounter (HOSPITAL_COMMUNITY): Payer: Self-pay

## 2020-03-11 ENCOUNTER — Other Ambulatory Visit: Payer: Self-pay

## 2020-03-11 ENCOUNTER — Other Ambulatory Visit (HOSPITAL_COMMUNITY): Payer: BC Managed Care – PPO | Admitting: Psychiatry

## 2020-03-11 DIAGNOSIS — F32A Depression, unspecified: Secondary | ICD-10-CM | POA: Diagnosis not present

## 2020-03-11 DIAGNOSIS — F418 Other specified anxiety disorders: Secondary | ICD-10-CM

## 2020-03-11 NOTE — Progress Notes (Signed)
Virtual Visit via Video Note  I connected with [name] on 03/11/20 at  9:00 AM EST by a video enabled telemedicine application and verified that I am speaking with the correct person using two identifiers.  At orientation to the IOP program, Case Manager discussed the limitations of evaluation and management by telemedicine and the availability of in person appointments. The Donna Chambers expressed understanding and agreed to proceed with virtual visits throughout the duration of the program.   Location:  Donna Chambers: Donna Chambers Home Provider: Home Office   History of Present Illness: Depression with Anxiety  Observations/Objective: Check In: Case Manager checked in with all participants to review discharge dates, insurance authorizations, work-related documents and needs from the treatment team.     Initial Therapeutic Activity: Client stated needs and engaged in discussion. Counselor facilitated a group processing with group members to assess mood and current functioning. Client reports that she is "doing ok". Client was triggered by the topics discussed by other group members as they were checking in, causing her to become teary, concerned and anxious. Counselor prompted group members to engage in grounding and relaxation techniques. Client able to self-regulate in order to proceed with group, expressing thoughts and feelings in an appropriate manner. Client presents with moderate depression and moderate anxiety. Client denied any current SI/HI/psychosis.    Second Therapeutic Activity: Counselor reviewed information covered regarding application skills of Self-Compassion. Counselor provided additional psychoeducation on topic using leading research by Dr. [name]. Counselor walked group through the self-soothing touch and self-soothing affirmations emphasizing kindness, validation and relaxation. Client stated that the practice was relaxing, comforting and a skill she plans to use in the  future to self-sooth and show compassion.   Check Out: Counselor closed group by checking in with Clients to determine what self-care practice or productivity activity they can engage over the weekend to alleviate stress/anxiety. Client plans to prepare for the upcoming holiday to reduce anxiety and stress. Client endorsed safety plan to be followed to prevent safety issues.  Assessment and Plan: Clinician recommends that Client remain in IOP treatment to better manage mental health symptoms, stabilization and to address treatment plan goals. Clinician recommends adherence to crisis/safety plan, taking medications as prescribed, and following up with medical professionals if any issues arise.   Follow Up Instructions: Clinician will send Webex link for next session. The Client was advised to call back or seek an in-person evaluation if the symptoms worsen or if the condition fails to improve as anticipated.     I provided 180 minutes of non-face-to-face time during this encounter.     Lise Auer, LCSW

## 2020-03-14 ENCOUNTER — Other Ambulatory Visit (HOSPITAL_COMMUNITY): Payer: BC Managed Care – PPO | Admitting: Psychiatry

## 2020-03-14 ENCOUNTER — Other Ambulatory Visit: Payer: Self-pay

## 2020-03-14 ENCOUNTER — Encounter (HOSPITAL_COMMUNITY): Payer: Self-pay | Admitting: Psychiatry

## 2020-03-14 DIAGNOSIS — F32A Depression, unspecified: Secondary | ICD-10-CM | POA: Diagnosis not present

## 2020-03-14 DIAGNOSIS — F418 Other specified anxiety disorders: Secondary | ICD-10-CM

## 2020-03-14 NOTE — Progress Notes (Signed)
Virtual Visit via Video Note  I connected with Donna Chambers on 03/14/20 at  9:00 AM EST by a video enabled telemedicine application and verified that I am speaking with the correct person using two identifiers.  At orientation to the IOP program, Case Manager discussed the limitations of evaluation and management by telemedicine and the availability of in person appointments. The patient expressed understanding and agreed to proceed with virtual visits throughout the duration of the program.   Location:  Patient: Patient Home Provider: Home Office   History of Present Illness: Depression and Anxiety  Observations/Objective: Check In: Case Manager checked in with all participants to review discharge dates, insurance authorizations, work-related documents and needs from the treatment team.     Initial Therapeutic Activity: Client stated needs and engaged in discussion. Counselor facilitated a group processing with group members to assess mood and current functioning. Client reports that she faced challenges over the weekend with expressing and protecting needs. Client shared that she had increased worry and anxiety related to concerns with friends and family members. Client was unable to attend an outing with a friend due to mood. Client reports that she is linking change in mood with interacts with spouse and in connection with days of therapy or not. Client reports benefits with participating daily in treatment. Client presents with moderate depression and severe anxiety. Client denied any current SI/HI/psychosis.    Second Therapeutic Activity: Counselor prompted group members to journal about their knowledge of boundaries and boundary setting. Counselor processed their experience and ideas with boundary setting, identifying areas for growth. Counselor shared psychoeducational information using worksheets on the types of boundaries, porous, rigid and healthy. Client identified that they  have primarily porous boundaries and would like to learn skills in implementing healthier boundaries. Counselor shared about the different areas where boundaries can be applied. Client noted that she would like to become more firm and heathy in her emotional and sexual boundaries.   Check Out: Counselor closed group by checking in with Clients to determine what self-care practice or productivity activity they can engage today to alleviate stress/anxiety. Counselor challenged group members to be mindful of boundary setting, intentional or subconscious, in their interactions with others today. Client plans to write a message to a friend. Client endorsed safety plan to be followed to prevent safety issues.  Assessment and Plan: Clinician recommends that Client remain in IOP treatment to better manage mental health symptoms, stabilization and to address treatment plan goals. Clinician recommends adherence to crisis/safety plan, taking medications as prescribed, and following up with medical professionals if any issues arise.   Follow Up Instructions: Clinician will send Webex link for next session. The Client was advised to call back or seek an in-person evaluation if the symptoms worsen or if the condition fails to improve as anticipated.     I provided 180 minutes of non-face-to-face time during this encounter.     Lise Auer, LCSW

## 2020-03-15 ENCOUNTER — Other Ambulatory Visit (HOSPITAL_COMMUNITY): Payer: BC Managed Care – PPO | Admitting: Psychiatry

## 2020-03-15 ENCOUNTER — Encounter (HOSPITAL_COMMUNITY): Payer: Self-pay

## 2020-03-15 ENCOUNTER — Other Ambulatory Visit: Payer: Self-pay

## 2020-03-15 DIAGNOSIS — F32A Depression, unspecified: Secondary | ICD-10-CM | POA: Diagnosis not present

## 2020-03-15 DIAGNOSIS — F418 Other specified anxiety disorders: Secondary | ICD-10-CM

## 2020-03-15 NOTE — Progress Notes (Signed)
Virtual Visit via Video Note  I connected with [name] on 03/15/20 at  9:00 AM EST by a video enabled telemedicine application and verified that I am speaking with the correct person using two identifiers.  At orientation to the IOP program, Case Manager discussed the limitations of evaluation and management by telemedicine and the availability of in person appointments. The patient expressed understanding and agreed to proceed with virtual visits throughout the duration of the program.   Location:  Patient: Patient Home Provider: Home Office   History of Present Illness: Depression and Anxiety  Observations/Objective: Check In: Case Manager checked in with all participants to review discharge dates, insurance authorizations, work-related documents and needs from the treatment team.     Initial Therapeutic Activity: Client stated needs and engaged in discussion. Counselor facilitated a group processing with group members to assess mood and current functioning. Client reports that she is in a "good mood" today, feeling well enough to wake up early to start on tasks on her to do list this morning. Client stated that she is not having as many episodes of crying throughout the day and is managing anxiety better with cognitive coping skills. Group discussed mental health services, outcomes, advocating for mental health needs, future planning. Client presents with moderate depression and moderate anxiety. Client denied any current SI/HI/psychosis.    Second Therapeutic Activity: Counselor reviewed information on Boundary Types and finished up first handout. Counselor allowed for discussion on how Client historically set and communicate boundaries. Counselor presented information from an additional worksheet which highlighted tips and skills in setting healthy and assertive boundaries with others. Counselor and Group members practice role playing skills. Counselor gave group members a final  worksheet for homework for reflection and application on how to make intentional changes and action plan for establishing healthier boundaries in their lives to reduce depression and anxiety associate with porous and rigid boundaries.  Check Out: Counselor closed group by checking in with Clients to determine what self-care practice or productivity activity they can engage today to alleviate stress/anxiety. Client plans to complete boundaries exploration worksheet and do a 5 minute  Meditation.Client endorsed safety plan to be followed to prevent safety issues.  Assessment and Plan: Clinician recommends that Client remain in IOP treatment to better manage mental health symptoms, stabilization and to address treatment plan goals. Clinician recommends adherence to crisis/safety plan, taking medications as prescribed, and following up with medical professionals if any issues arise.   Follow Up Instructions: Clinician will send Webex link for next session. The Client was advised to call back or seek an in-person evaluation if the symptoms worsen or if the condition fails to improve as anticipated.     I provided 180 minutes of non-face-to-face time during this encounter.     Lise Auer, LCSW

## 2020-03-16 ENCOUNTER — Encounter (HOSPITAL_COMMUNITY): Payer: Self-pay | Admitting: Psychiatry

## 2020-03-16 ENCOUNTER — Other Ambulatory Visit (HOSPITAL_COMMUNITY): Payer: BC Managed Care – PPO | Admitting: Psychiatry

## 2020-03-16 ENCOUNTER — Other Ambulatory Visit: Payer: Self-pay

## 2020-03-16 DIAGNOSIS — F418 Other specified anxiety disorders: Secondary | ICD-10-CM

## 2020-03-16 NOTE — Progress Notes (Signed)
Virtual Visit via Video Note  I connected with Donna Chambers on 03/16/20 at  9:00 AM EST by a video enabled telemedicine application and verified that I am speaking with the correct person using two identifiers.  At orientation to the IOP program, Case Manager discussed the limitations of evaluation and management by telemedicine and the availability of in person appointments. The patient expressed understanding and agreed to proceed with virtual visits throughout the duration of the program.   Location:  Patient: Patient Home Provider: Home Office   History of Present Illness: Depression and Anxiety  Observations/Objective: Check In: Case Manager checked in with all participants to review discharge dates, insurance authorizations, work-related documents and needs from the treatment team.     Initial Therapeutic Activity: Client stated needs and engaged in discussion. Counselor facilitated a group processing with group members to assess mood and current functioning. Client reports that she feels better when she is in group and is finding benefit from being able to process emotions with everyone in a therapeutic and supportive way. Group discussed mental health services, outcomes, advocating for mental health needs, future planning. Client presents with moderate depression and moderate anxiety. Client denied any current SI/HI/psychosis.    Second Therapeutic Activity: Counselor introduced group to the resource Therapy In a Nutshell, which features a series of videos by Anne Fu, LMFT on Mental Health issues, concepts and skills. Counselor shared a video about skills on combating the urge to suppress emotions. Counselor processed information presented with group afterwards to application and takeaways. Client engaged in discussion and resonated with concepts, with intention for application and further exploration.  Check Out: Counselor closed group by checking in with Clients to determine  what self-care practice or productivity activity they can engage today to alleviate stress/anxiety. Client plans to eliminate some items from her to do list and talk with husband about needs. Client endorsed safety plan to be followed to prevent safety issues.  Assessment and Plan: Clinician recommends that Client remain in IOP treatment to better manage mental health symptoms, stabilization and to address treatment plan goals. Clinician recommends adherence to crisis/safety plan, taking medications as prescribed, and following up with medical professionals if any issues arise.   Follow Up Instructions: Clinician will send Webex link for next session. The Client was advised to call back or seek an in-person evaluation if the symptoms worsen or if the condition fails to improve as anticipated.     I provided 180 minutes of non-face-to-face time during this encounter.     Lise Auer, LCSW

## 2020-03-17 ENCOUNTER — Other Ambulatory Visit: Payer: Self-pay

## 2020-03-17 ENCOUNTER — Other Ambulatory Visit (HOSPITAL_COMMUNITY): Payer: BC Managed Care – PPO | Admitting: Psychiatry

## 2020-03-17 DIAGNOSIS — F418 Other specified anxiety disorders: Secondary | ICD-10-CM

## 2020-03-17 DIAGNOSIS — F32A Depression, unspecified: Secondary | ICD-10-CM | POA: Diagnosis not present

## 2020-03-17 NOTE — Patient Instructions (Signed)
D:  Patient will discharge on 03-24-20.  A:  Discharge on 03-24-20 after successful completion of MH-IOP.  Follow up with [name] and [name] (therapist).  Encouraged support groups through [facility].  R:  Patient receptive.

## 2020-03-18 ENCOUNTER — Telehealth (HOSPITAL_COMMUNITY): Payer: Self-pay | Admitting: Psychiatry

## 2020-03-18 ENCOUNTER — Encounter (HOSPITAL_COMMUNITY): Payer: Self-pay | Admitting: Psychiatry

## 2020-03-18 ENCOUNTER — Other Ambulatory Visit (HOSPITAL_COMMUNITY): Payer: BC Managed Care – PPO | Admitting: Psychiatry

## 2020-03-18 ENCOUNTER — Other Ambulatory Visit: Payer: Self-pay

## 2020-03-18 DIAGNOSIS — F32A Depression, unspecified: Secondary | ICD-10-CM | POA: Diagnosis not present

## 2020-03-18 DIAGNOSIS — F418 Other specified anxiety disorders: Secondary | ICD-10-CM

## 2020-03-18 NOTE — Progress Notes (Addendum)
Virtual Visit via Video Note  I connected with Donna Chambers on 03/18/20 at  9:00 AM EST by a video enabled telemedicine application and verified that I am speaking with the correct person using two identifiers.  At orientation to the IOP program, Case Manager discussed the limitations of evaluation and management by telemedicine and the availability of in person appointments. The patient expressed understanding and agreed to proceed with virtual visits throughout the duration of the program.   Location:  Patient: Patient Home Provider: Home Office   History of Present Illness: Depression and Anxiety  Observations/Objective: Check In: Case Manager checked in with all participants to review discharge dates, insurance authorizations, work-related documents and needs from the treatment team. Client stated needs and engaged in discussion. Counselor facilitated a group processing with group members to assess mood and current functioning. Client reports that she is not feeling well today, having trouble with sleep and flu/allergy like symptoms over past 48 hours. Client stated that she is feeling overwhelmed with work and home life responsibilities. Client shared that she verbalized some of her needs to her family and they responded positively, extending care and support for her. Client is sad about holiday season being and feeling different from past years. Group processed these feelings with Client.  Client presents with moderate depression and moderate anxiety. Client denied any current SI/HI/psychosis.    Initial Therapeutic Activity: Counselor provided group members with strategies for improved sleep hygiene. Group members shared about their unhealthy sleep patterns and habits. Group members identified ways they can utilize healthy sleep hygiene approaches.    Client decided to excuse self from group early, as she is experiencing health complications to allergies and may be positive for COVID.  Client affirmed that she will follow safety plan over weekend and reach out to afterhour services if needed.  Assessment and Plan: Clinician recommends that Client remain in IOP treatment to better manage mental health symptoms, stabilization and to address treatment plan goals. Clinician recommends adherence to crisis/safety plan, taking medications as prescribed, and following up with medical professionals if any issues arise.   Follow Up Instructions: Clinician will send Webex link for next session. The Client was advised to call back or seek an in-person evaluation if the symptoms worsen or if the condition fails to improve as anticipated.     I provided 90 minutes of non-face-to-face time during this encounter.     Lise Auer, LCSW

## 2020-03-18 NOTE — Progress Notes (Signed)
  Virtual Visit via Video Note  I connected with Donna Chambers on @TODAY @ at  9:00 AM EST by a video enabled telemedicine application and verified that I am speaking with the correct person using two identifiers.  Location: Patient: at home Provider: at office   I discussed the limitations of evaluation and management by telemedicine and the availability of in person appointments. The patient expressed understanding and agreed to proceed.  I discussed the assessment and treatment plan with the patient. The patient was provided an opportunity to ask questions and all were answered. The patient agreed with the plan and demonstrated an understanding of the instructions.   The patient was advised to call back or seek an in-person evaluation if the symptoms worsen or if the condition fails to improve as anticipated.  I provided 20 minutes of non-face-to-face time during this encounter.   Patient ID: Donna Chambers, female   DOB: 30-Jun-1975, 45 y.o.   MRN: [id number] As per previous CCA states:  This is a 44 yr old, married, employed, Caucasian female who was referred per Dr. Chucky Chambers d/t worsening depressive and anxiety symptoms.  Reports multiple stressors:  1) Marriage:  On August 25th pt received a phone call that her husband of almost 1 yr had been having an affair.  "Someone called and left me a vm on my cell phone."  According to pt, she has been with her husband since 2007; but they just celebrated their 1 yr anniversary on Sept. 7th.  "My husband was just dx'd with Bipolor D/O last week; but he has Addison's Disease and Diabetes also.  2)  Job of 23 yrs.  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  States she works 50-60 hrs per week with no support.  States she wears many hats and just recently got a new boss.  The payroll company was just recently switched.  3)  Parents:  According to pt, her mother calls her a lot.  "My mom is all in my business."  Pt denies any prior psychiatric  hospitalizations.  Denies any previous suicide attempts/gestures.  Has been seeing Dr. Chucky Chambers for a couple of yrs.  Has seen Donna Chambers, [facility] twice and is currently seeina a marital therapist Donna Chambers) since Sept. 2021.  Family hx:  Father and PGF (ETOH); Younger sister and Mom (Anxiety); Older Sister (ADD).  Patient has attended all scheduled days in virtual MH-IOP so far.  She will be discharged on 03-24-20.  According to pt, she is feeling better but has her good days and bad days.  "Sometimes I'm triggered in the groups.  I feel everyone's feelings."  Pt did state that she has learned a lot of coping skills.  "I am working through the grief now, I was distracted earlier.  My husband states that I am worse now." On a scale of 1-10 (10 being the worst); pt rated the depression #8 and anxiety #9. "Today isn't a good day."  Pt states that her stepdaughter is positive for COVID; so she went and got tested yesterday and is awaiting the results. Pt denies SI/HI or A/V hallucinations.  A:  D/C on 03-24-20.  F/u with Dr. Chucky Chambers on 03-28-20 @ 2:15 pm and Donna Chambers, [facility] on 03-29-20 @ 11 a.m.  Strongly encouraged pt to attend support groups through [facility].  Pt to return to regular job duties on 03-28-20.  R:  Pt receptive.  Donna Chambers, M.Ed, CNA

## 2020-03-18 NOTE — Progress Notes (Addendum)
Virtual Visit via Video Note  I connected with [name] on 03/18/20 at  9:00 AM EST by a video enabled telemedicine application and verified that I am speaking with the correct person using two identifiers.  At orientation to the IOP program, Case Manager discussed the limitations of evaluation and management by telemedicine and the availability of in person appointments. The patient expressed understanding and agreed to proceed with virtual visits throughout the duration of the program.   Location:  Patient: Patient Home Provider: Home Office   History of Present Illness: Depression and Anxiety  Observations/Objective: Check In: Case Manager checked in with all participants to review discharge dates, insurance authorizations, work-related documents and needs from the treatment team. Client stated needs and engaged in discussion. Counselor facilitated a group processing with group members to assess mood and current functioning. Client reports feeling sad and down about the upcoming holidays, as she anticipated things to be very different and distant. Group discussed mental health services, outcomes, advocating for mental health needs, future planning. Client presents with moderate depression and moderate anxiety. Client denied any current SI/HI/psychosis.    Initial Therapeutic Activity: Counselor engaged group in creating a Paediatric nurse and how to implement boundaries to protect and advocate for mental health. Group generated coping strategies and ideas to incorporate in their plans. Counselor provided crisis numbers and contact for group to save to their phones and share with support system. Group members shared their plans aloud and received feedback to strengthen and support plan. When discussing boundaries, she said "it depends on who it is and the repercussions" as to her comfort level with establishing boundaries. Briefly discussed infidelity in marriage and  challenges she has been experiencing during the holiday season.   Second Therapeutic Activity: Counselor introduced [employer], representative with [place] to share about programming. Group Members asked questions and engaged in discussion, as [name] shared about Peer Support, Support Groups and the [employer]. Client stated that they are interested in connecting with the Women's Emotional Support Group.  Check Out: Counselor closed group by checking in with Clients to determine what self-care practice or productivity activity they can engage today to alleviate stress/anxiety. Client plans to rest and address physical health needs. Client endorsed safety plan to be followed to prevent safety issues.  Assessment and Plan: Clinician recommends that Client remain in IOP treatment to better manage mental health symptoms, stabilization and to address treatment plan goals. Clinician recommends adherence to crisis/safety plan, taking medications as prescribed, and following up with medical professionals if any issues arise.   Follow Up Instructions: Clinician will send Webex link for next session. The Client was advised to call back or seek an in-person evaluation if the symptoms worsen or if the condition fails to improve as anticipated.     I provided 180 minutes of non-face-to-face time during this encounter.     Lise Auer, LCSW

## 2020-03-22 ENCOUNTER — Other Ambulatory Visit (HOSPITAL_COMMUNITY): Payer: BC Managed Care – PPO | Admitting: Psychiatry

## 2020-03-22 ENCOUNTER — Other Ambulatory Visit: Payer: Self-pay

## 2020-03-22 ENCOUNTER — Encounter (HOSPITAL_COMMUNITY): Payer: Self-pay

## 2020-03-22 DIAGNOSIS — F418 Other specified anxiety disorders: Secondary | ICD-10-CM

## 2020-03-22 DIAGNOSIS — F32A Depression, unspecified: Secondary | ICD-10-CM | POA: Diagnosis not present

## 2020-03-22 NOTE — Progress Notes (Signed)
Virtual Visit via Video Note  I connected with Donna Chambers on 03/22/20 at  9:00 AM EST by a video enabled telemedicine application and verified that I am speaking with the correct person using two identifiers.  At orientation to the IOP program, Case Manager discussed the limitations of evaluation and management by telemedicine and the availability of in person appointments. The patient expressed understanding and agreed to proceed with virtual visits throughout the duration of the program.   Location:  Patient: Patient Home Provider: Home Office   History of Present Illness: Depression and Anxiety  Observations/Objective: Check In: Case Manager checked in with all participants to review discharge dates, insurance authorizations, work-related documents and needs from the treatment team. Client stated needs and engaged in discussion.     Initial Therapeutic Activity: Counselor facilitated a group processing with group members to assess mood and current functioning. Client reports that she experienced an emotional holiday weekend, stating that she felt sadness that things were so different this year, as her family has COVID and she is dealing with significant marital issues. Client noted that she was able to apply coping skills to regulate emotions. Client advocating for more personal time and space to process emotions. Client presents with moderate depression and moderate anxiety. Client denied any current SI/HI/psychosis.  Second Therapeutic Activity: Counselor engaged group in discussion about the concept of Forgiveness. Group members shared about anger associated with being unable to fully forgive. Counselor provided information on myths about forgiveness. Counselor shared a document on the 5 Stages of Forgiveness. Client identified that they are stuck in stage 2, processing their emotions. Client would like to process with therapist on a one on one setting using the CBT interventions.    Check Out: Counselor prompted group members to identify one self-care practice or productivity activity they would like to engage in today. Client plans to type out her "story", regarding the traumas she has experienced this past year. Client endorsed safety plan to be followed to prevent safety issues.  Assessment and Plan: Clinician recommends that Client remain in IOP treatment to better manage mental health symptoms, stabilization and to address treatment plan goals. Clinician recommends adherence to crisis/safety plan, taking medications as prescribed, and following up with medical professionals if any issues arise.   Follow Up Instructions: Clinician will send Webex link for next session. The Client was advised to call back or seek an in-person evaluation if the symptoms worsen or if the condition fails to improve as anticipated.     I provided 180 minutes of non-face-to-face time during this encounter.     Donna Odor, LCSW

## 2020-03-23 ENCOUNTER — Encounter (HOSPITAL_COMMUNITY): Payer: Self-pay | Admitting: Family

## 2020-03-23 ENCOUNTER — Other Ambulatory Visit (HOSPITAL_COMMUNITY): Payer: BC Managed Care – PPO | Admitting: Family

## 2020-03-23 ENCOUNTER — Other Ambulatory Visit: Payer: Self-pay

## 2020-03-23 DIAGNOSIS — F418 Other specified anxiety disorders: Secondary | ICD-10-CM

## 2020-03-23 DIAGNOSIS — F32A Depression, unspecified: Secondary | ICD-10-CM | POA: Diagnosis not present

## 2020-03-23 NOTE — Progress Notes (Signed)
Virtual Visit via Video Note  I connected with Donna Chambers on 03/23/20 at  9:00 AM EST by a video enabled telemedicine application and verified that I am speaking with the correct person using two identifiers.  At orientation to the IOP program, Case Manager discussed the limitations of evaluation and management by telemedicine and the availability of in person appointments. The patient expressed understanding and agreed to proceed with virtual visits throughout the duration of the program.   Location:  Patient: Patient Home Provider: Home Office   History of Present Illness: Depression and Anxiety  Observations/Objective: Check In: Case Manager checked in with all participants to review discharge dates, insurance authorizations, work-related documents and needs from the treatment team. Client stated needs and engaged in discussion. Counselor facilitated a group processing with group members to assess mood and current functioning. Client _. Client presents with _ depression and _ anxiety. Client denied any current SI/HI/psychosis.    Initial Therapeutic Activity: Counselor introduced our guest speaker, Peggye Fothergill, [facility] Pharmacist, who shared about psychiatric medications, side effects, treatment considerations and how to communicate with medical professionals. Group Members asked questions and shared medication concerns.    Second Therapeutic Activity: Counselor prompted group members to reference a worksheet called, "Body Scan" to jot down questions and concerns about their physical health in preparation for their upcoming appointments with medical professionals. _ Counselor encouraged routine medical check-ups, preparing for appointments, following up with recommendations and seeking specialist if needed.  Check Out: Counselor prompted group members to identify one self-care practice or productivity activity they would like to engage in today. Client plans to _. Client endorsed safety  plan to be followed to prevent safety issues.  Assessment and Plan: Clinician recommends that Client remain in IOP treatment to better manage mental health symptoms, stabilization and to address treatment plan goals. Clinician recommends adherence to crisis/safety plan, taking medications as prescribed, and following up with medical professionals if any issues arise.   Follow Up Instructions: Clinician will send Webex link for next session. The Client was advised to call back or seek an in-person evaluation if the symptoms worsen or if the condition fails to improve as anticipated.     I provided 180 minutes of non-face-to-face time during this encounter.     Hilbert Odor, LCSW

## 2020-03-23 NOTE — Progress Notes (Signed)
Virtual Visit via Video Note  I connected with Donna Chambers on 03/23/20 at  9:00 AM EST by a video enabled telemedicine application and verified that I am speaking with the correct person using two identifiers.  Location: Patient: Home Provider: Home   I discussed the limitations of evaluation and management by telemedicine and the availability of in person appointments. The patient expressed understanding and agreed to proceed.   I discussed the assessment and treatment plan with the patient. The patient was provided an opportunity to ask questions and all were answered. The patient agreed with the plan and demonstrated an understanding of the instructions.   The patient was advised to call back or seek an in-person evaluation if the symptoms worsen or if the condition fails to improve as anticipated.  I provided 15 minutes of non-face-to-face time during this encounter.   Oneta Rack, NP   [facility] Intensive Outpatient Program Discharge Summary  Donna Chambers [id number]  Admission date: 02/22/2020 Discharge date: 03/23/2020 .   Reason for admission: Donna Chambers 44 year old Caucasian female who presents with worsening depression and anxiety.  States she learned that her husband has been having an affair for the past year.  Patient reports feelings of worthlessness and struggling with low self-esteem and feelings of self-doubt due to her  husband's infidelity.  Reports ongoing ruminations, worsening depression and trust issues. Stated that they have attended couples counseling in the past.    Progress in Program Toward Treatment Goals: Ongoing, patient attended and participated with daily group sessions with active and engaged participation.  Denying suicidal or homicidal ideations.  Denies auditory or visual hallucinations.  Reports "group has been helpful especially this time of the year" rating her depression 3 out of 10 with 10 being the worst.   Continues to endorse stress related to work life balance.  Patient encouraged to continue using coping skills.  Patient was receptive to plan.  Declined medication refills at this time. Support, encouragement reassurance was provided.  Progress (rationale): Keep follow-up appointment with [facility] March 28, 2020, keep follow-up appointment with outpatient therapist Archie Balboa, LCSW 1/4/2022at 11:00 am   Take all medications as prescribed. Keep all follow-up appointments as scheduled.  Do not consume alcohol or use illegal drugs while on prescription medications. Report any adverse effects from your medications to your primary care provider promptly.  In the event of recurrent symptoms or worsening symptoms, call 911, a crisis hotline, or go to the nearest emergency department for evaluation.   Oneta Rack, NP 03/23/2020

## 2020-03-24 ENCOUNTER — Other Ambulatory Visit: Payer: Self-pay

## 2020-03-24 ENCOUNTER — Other Ambulatory Visit (HOSPITAL_COMMUNITY): Payer: BC Managed Care – PPO | Admitting: Licensed Clinical Social Worker

## 2020-03-24 DIAGNOSIS — F418 Other specified anxiety disorders: Secondary | ICD-10-CM

## 2020-03-24 DIAGNOSIS — F32A Depression, unspecified: Secondary | ICD-10-CM | POA: Diagnosis not present

## 2020-04-09 NOTE — Progress Notes (Signed)
Virtual Visit via Video Note  I connected with Donna Chambers on 03/24/20 at  9:00 AM EST by a video enabled telemedicine application and verified that I am speaking with the correct person using two identifiers.  Location: Patient: patient home Provider: clinical home office   I discussed the limitations of evaluation and management by telemedicine and the availability of in person appointments. The patient expressed understanding and agreed to proceed.  I discussed the assessment and treatment plan with the patient. The patient was provided an opportunity to ask questions and all were answered. The patient agreed with the plan and demonstrated an understanding of the instructions.   The patient was advised to call back or seek an in-person evaluation if the symptoms worsen or if the condition fails to improve as anticipated.  Pt was provided 180 minutes of non-face-to-face time during this encounter.   Lorin Glass, LCSW     Daily Group Progress Note  Program: IOP  Group Time: 9:00 - 10:00  Participation Level: Active  Behavioral Response: Appropriate and Sharing  Type of Therapy:  Group Therapy  Summary of Progress: Chaplain, C. Hardin Negus, led grief and loss group. Pt's were given space to process loss through a broad lens and provide support and connection with one another.  Pt spoke on loss as she has experienced it and provided support to other group members.     Group Time: 10:00 - 11:00  Participation Level:  Active  Behavioral Response: Appropriate and Sharing  Type of Therapy: Group Therapy  Summary of Progress: Clinician led check-in regarding current stressors and situation. Clinician utilized active listening and empathetic response and validated patient emotions. Clinician facilitated processing group on pertinent issues.  Patient rates hermood at [facility] a scale of 1-10 with 10 being great. Pt states she was busy yesterday. Pt states she is feeling  grateful for this group and more confident in her ability to manage her feelings.      Group Time: 11:00 - 12:00  Participation Level:  Active  Behavioral Response: Appropriate and Sharing  Type of Therapy: Group Therapy  Summary of Progress: Cln discussed topic of boundaries and introduced how to set and maintain healthy boundaries. Cln utilized handout "how to set boundaries" and group members worked through examples to practice setting appropriate boundaries.  Pt engaged in discussion and reports struggle with feeling confident to set boundaries.  At checkout, pt denies SI/HI and states understanding of crisis services should she need them over the long weekend. Pt will discharge from IOP today as planned and states understanding of her discharge plan.      Lorin Glass, LCSW

## 2020-04-14 ENCOUNTER — Encounter: Payer: BC Managed Care – PPO | Admitting: Gastroenterology

## 2020-08-30 ENCOUNTER — Other Ambulatory Visit: Payer: Self-pay

## 2020-08-30 ENCOUNTER — Ambulatory Visit (AMBULATORY_SURGERY_CENTER): Payer: Self-pay | Admitting: *Deleted

## 2020-08-30 VITALS — Ht 63.0 in | Wt 161.0 lb

## 2020-08-30 DIAGNOSIS — Z1211 Encounter for screening for malignant neoplasm of colon: Secondary | ICD-10-CM

## 2020-08-30 MED ORDER — NA SULFATE-K SULFATE-MG SULF 17.5-3.13-1.6 GM/177ML PO SOLN: ORAL | 0 refills | Status: DC

## 2020-08-30 NOTE — Progress Notes (Signed)
Patient is here in-person for PV. Patient denies any allergies to eggs or soy. Patient denies any problems with anesthesia/sedation. Patient denies any oxygen use at home. Patient denies taking any diet/weight loss medications or blood thinners. Patient is not being treated for MRSA or C-diff. Patient is aware of our care-partner policy and YIFOY-77 safety protocol. EMMI education assigned to the patient for the procedure, sent to [facility].   Patient is COVID-19 vaccinated, per patient.  Patient did want to drink large volume prep. suprep given pt aware of cost.

## 2020-09-07 ENCOUNTER — Telehealth: Payer: Self-pay | Admitting: Gastroenterology

## 2020-09-07 NOTE — Telephone Encounter (Addendum)
Returned pts call. LM on VM for patient to call back.  Suprep was sent to [facility] in [place] on 08/30/20.  Need to confirm that this is the correct pharmacy.

## 2020-09-07 NOTE — Telephone Encounter (Signed)
Inbound call from patient stating pharmacy has not received prep medication.  Please advise.

## 2020-09-13 ENCOUNTER — Other Ambulatory Visit: Payer: Self-pay

## 2020-09-13 ENCOUNTER — Ambulatory Visit (AMBULATORY_SURGERY_CENTER): Payer: BC Managed Care – PPO | Admitting: Gastroenterology

## 2020-09-13 ENCOUNTER — Encounter: Payer: Self-pay | Admitting: Gastroenterology

## 2020-09-13 VITALS — BP 123/71 | HR 63 | Temp 97.5°F | Resp 14 | Wt 161.0 lb

## 2020-09-13 DIAGNOSIS — D120 Benign neoplasm of cecum: Secondary | ICD-10-CM

## 2020-09-13 DIAGNOSIS — K635 Polyp of colon: Secondary | ICD-10-CM

## 2020-09-13 DIAGNOSIS — Z1211 Encounter for screening for malignant neoplasm of colon: Secondary | ICD-10-CM | POA: Diagnosis not present

## 2020-09-13 MED ORDER — SODIUM CHLORIDE 0.9 % IV SOLN: 500.0000 mL | Freq: Once | INTRAVENOUS | Status: DC

## 2020-09-13 NOTE — Progress Notes (Signed)
Called to room to assist during endoscopic procedure.  Patient ID and intended procedure confirmed with present staff. Received instructions for my participation in the procedure from the performing physician.  

## 2020-09-13 NOTE — Progress Notes (Signed)
pt tolerated well. VSS. awake and to recovery. Report given to RN.  

## 2020-09-13 NOTE — Progress Notes (Signed)
Pt's states no medical or surgical changes since previsit or office visit. 

## 2020-09-13 NOTE — Patient Instructions (Signed)
Handout provided on polyps.   YOU HAD AN ENDOSCOPIC PROCEDURE TODAY AT THE [facility]:   Refer to the procedure report that was given to you for any specific questions about what was found during the examination.  If the procedure report does not answer your questions, please call your gastroenterologist to clarify.  If you requested that your care partner not be given the details of your procedure findings, then the procedure report has been included in a sealed envelope for you to review at your convenience later.  YOU SHOULD EXPECT: Some feelings of bloating in the abdomen. Passage of more gas than usual.  Walking can help get rid of the air that was put into your GI tract during the procedure and reduce the bloating. If you had a lower endoscopy (such as a colonoscopy or flexible sigmoidoscopy) you may notice spotting of blood in your stool or on the toilet paper. If you underwent a bowel prep for your procedure, you may not have a normal bowel movement for a few days.  Please Note:  You might notice some irritation and congestion in your nose or some drainage.  This is from the oxygen used during your procedure.  There is no need for concern and it should clear up in a day or so.  SYMPTOMS TO REPORT IMMEDIATELY:  Following lower endoscopy (colonoscopy or flexible sigmoidoscopy):  Excessive amounts of blood in the stool  Significant tenderness or worsening of abdominal pains  Swelling of the abdomen that is new, acute  Fever of 100F or higher  For urgent or emergent issues, a gastroenterologist can be reached at any hour by calling [phone]. Do not use MyChart messaging for urgent concerns.    DIET:  We do recommend a small meal at first, but then you may proceed to your regular diet.  Drink plenty of fluids but you should avoid alcoholic beverages for 24 hours.  ACTIVITY:  You should plan to take it easy for the rest of today and you should NOT DRIVE or use heavy  machinery until tomorrow (because of the sedation medicines used during the test).    FOLLOW UP: Our staff will call the number listed on your records 48-72 hours following your procedure to check on you and address any questions or concerns that you may have regarding the information given to you following your procedure. If we do not reach you, we will leave a message.  We will attempt to reach you two times.  During this call, we will ask if you have developed any symptoms of COVID 19. If you develop any symptoms (ie: fever, flu-like symptoms, shortness of breath, cough etc.) before then, please call [phone].  If you test positive for Covid 19 in the 2 weeks post procedure, please call and report this information to us.    If any biopsies were taken you will be contacted by phone or by letter within the next 1-3 weeks.  Please call us at [phone] if you have not heard about the biopsies in 3 weeks.    SIGNATURES/CONFIDENTIALITY: You and/or your care partner have signed paperwork which will be entered into your electronic medical record.  These signatures attest to the fact that that the information above on your After Visit Summary has been reviewed and is understood.  Full responsibility of the confidentiality of this discharge information lies with you and/or your care-partner.  

## 2020-09-13 NOTE — Op Note (Signed)
[facility] Patient Name: Donna Chambers Procedure Date: 09/13/2020 8:52 AM MRN: [id number] Endoscopist: Ladene Artist , MD Age: 45 Referring MD:  Date of Birth: Oct 11, 1975 Gender: Female Account #: [id number] Procedure:                Colonoscopy Indications:              Screening for colorectal malignant neoplasm Medicines:                Monitored Anesthesia Care Procedure:                Pre-Anesthesia Assessment:                           - Prior to the procedure, a History and Physical                            was performed, and patient medications and                            allergies were reviewed. The patient's tolerance of                            previous anesthesia was also reviewed. The risks                            and benefits of the procedure and the sedation                            options and risks were discussed with the patient.                            All questions were answered, and informed consent                            was obtained. Prior Anticoagulants: The patient has                            taken no previous anticoagulant or antiplatelet                            agents. ASA Grade Assessment: II - A patient with                            mild systemic disease. After reviewing the risks                            and benefits, the patient was deemed in                            satisfactory condition to undergo the procedure.                           After obtaining informed consent, the colonoscope  was passed under direct vision. Throughout the                            procedure, the patient's blood pressure, pulse, and                            oxygen saturations were monitored continuously. The                            Olympus CF-HQ190L [id number]) Colonoscope was                            introduced through the anus and advanced to the the                            cecum,  identified by appendiceal orifice and                            ileocecal valve. The ileocecal valve, appendiceal                            orifice, and rectum were photographed. The quality                            of the bowel preparation was good. The colonoscopy                            was performed without difficulty. The patient                            tolerated the procedure well. Findings:                 The perianal and digital rectal examinations were                            normal.                           A 8 mm polyp was found in the cecum. The polyp was                            sessile. The polyp was removed with a cold snare.                            Resection and retrieval were complete.                           The exam was otherwise without abnormality on                            direct and retroflexion views. Complications:            No immediate complications. Estimated blood loss:  None. Estimated Blood Loss:     Estimated blood loss: none. Impression:               - One 8 mm polyp in the cecum, removed with a cold                            snare. Resected and retrieved.                           - The examination was otherwise normal on direct                            and retroflexion views. Recommendation:           - Repeat colonoscopy after studies are complete for                            surveillance based on pathology results.                           - Patient has a contact number available for                            emergencies. The signs and symptoms of potential                            delayed complications were discussed with the                            patient. Return to normal activities tomorrow.                            Written discharge instructions were provided to the                            patient.                           - Resume previous diet.                           -  Continue present medications.                           - Await pathology results. Ladene Artist, MD 09/13/2020 9:27:56 AM This report has been signed electronically.

## 2020-09-15 ENCOUNTER — Telehealth: Payer: Self-pay | Admitting: *Deleted

## 2020-09-15 ENCOUNTER — Telehealth: Payer: Self-pay

## 2020-09-15 NOTE — Telephone Encounter (Signed)
Called # [phone] and left a message we tried to reach pt for a follow up call. maw

## 2020-09-15 NOTE — Telephone Encounter (Signed)
Second follow up call made.

## 2020-09-26 ENCOUNTER — Encounter: Payer: Self-pay | Admitting: Gastroenterology

## 2023-03-01 ENCOUNTER — Other Ambulatory Visit (HOSPITAL_COMMUNITY): Payer: Self-pay

## 2023-03-01 MED ORDER — AMPHETAMINE-DEXTROAMPHET ER 30 MG PO CP24
60.0000 mg | ORAL_CAPSULE | Freq: Every morning | ORAL | 0 refills | Status: DC
  Filled 2023-03-04: qty 60, 30d supply, fill #0

## 2023-03-04 ENCOUNTER — Other Ambulatory Visit (HOSPITAL_COMMUNITY): Payer: Self-pay

## 2023-03-04 ENCOUNTER — Other Ambulatory Visit (HOSPITAL_BASED_OUTPATIENT_CLINIC_OR_DEPARTMENT_OTHER): Payer: Self-pay

## 2023-04-02 ENCOUNTER — Other Ambulatory Visit (HOSPITAL_COMMUNITY): Payer: Self-pay

## 2023-04-02 ENCOUNTER — Other Ambulatory Visit (HOSPITAL_BASED_OUTPATIENT_CLINIC_OR_DEPARTMENT_OTHER): Payer: Self-pay

## 2023-04-02 MED ORDER — AMPHETAMINE-DEXTROAMPHET ER 30 MG PO CP24
60.0000 mg | ORAL_CAPSULE | Freq: Every morning | ORAL | 0 refills | Status: DC
  Filled 2023-04-02: qty 180, 90d supply, fill #0

## 2023-04-03 ENCOUNTER — Other Ambulatory Visit (HOSPITAL_COMMUNITY): Payer: Self-pay

## 2023-07-02 ENCOUNTER — Other Ambulatory Visit (HOSPITAL_COMMUNITY): Payer: Self-pay

## 2023-07-02 MED ORDER — AMPHETAMINE-DEXTROAMPHET ER 30 MG PO CP24
60.0000 mg | ORAL_CAPSULE | Freq: Every morning | ORAL | 0 refills | Status: DC
  Filled 2023-07-02: qty 180, 90d supply, fill #0

## 2023-10-04 ENCOUNTER — Other Ambulatory Visit (HOSPITAL_COMMUNITY): Payer: Self-pay

## 2023-10-04 MED ORDER — AMPHETAMINE-DEXTROAMPHET ER 30 MG PO CP24
60.0000 mg | ORAL_CAPSULE | Freq: Every morning | ORAL | 0 refills | Status: AC
  Filled 2023-10-04: qty 78, 39d supply, fill #0
  Filled 2023-10-04: qty 102, 51d supply, fill #0

## 2023-12-31 ENCOUNTER — Other Ambulatory Visit (HOSPITAL_COMMUNITY): Payer: Self-pay

## 2023-12-31 MED ORDER — AMPHETAMINE-DEXTROAMPHET ER 30 MG PO CP24
60.0000 mg | ORAL_CAPSULE | Freq: Every morning | ORAL | 0 refills | Status: DC
  Filled 2024-01-02: qty 20, 10d supply, fill #0
  Filled 2024-01-02 (×2): qty 180, 90d supply, fill #0
  Filled 2024-01-03: qty 20, 10d supply, fill #0

## 2024-01-02 ENCOUNTER — Other Ambulatory Visit (HOSPITAL_COMMUNITY): Payer: Self-pay

## 2024-01-03 ENCOUNTER — Other Ambulatory Visit: Payer: Self-pay

## 2024-01-03 ENCOUNTER — Other Ambulatory Visit (HOSPITAL_COMMUNITY): Payer: Self-pay

## 2024-01-06 ENCOUNTER — Other Ambulatory Visit (HOSPITAL_COMMUNITY): Payer: Self-pay

## 2024-01-11 ENCOUNTER — Other Ambulatory Visit (HOSPITAL_COMMUNITY): Payer: Self-pay

## 2024-01-13 ENCOUNTER — Other Ambulatory Visit (HOSPITAL_COMMUNITY): Payer: Self-pay

## 2024-01-13 MED ORDER — AMPHETAMINE-DEXTROAMPHET ER 30 MG PO CP24
60.0000 mg | ORAL_CAPSULE | Freq: Every morning | ORAL | 0 refills | Status: DC
  Filled 2024-01-13: qty 180, 90d supply, fill #0

## 2024-04-10 ENCOUNTER — Other Ambulatory Visit (HOSPITAL_COMMUNITY): Payer: Self-pay

## 2024-04-13 ENCOUNTER — Other Ambulatory Visit (HOSPITAL_COMMUNITY): Payer: Self-pay

## 2024-04-13 MED ORDER — AMPHETAMINE-DEXTROAMPHET ER 30 MG PO CP24
60.0000 mg | ORAL_CAPSULE | Freq: Every morning | ORAL | 0 refills | Status: AC
  Filled 2024-04-13: qty 180, 90d supply, fill #0
